# Patient Record
Sex: Female | Born: 1935 | Race: White | Hispanic: No | Smoking: Never smoker
Health system: Southern US, Community
[De-identification: ages and names within clinical notes are randomized; demographics above are authoritative.]

## PROBLEM LIST (undated history)

## (undated) DIAGNOSIS — H547 Unspecified visual loss: Secondary | ICD-10-CM

## (undated) DIAGNOSIS — E782 Mixed hyperlipidemia: Secondary | ICD-10-CM

## (undated) DIAGNOSIS — E119 Type 2 diabetes mellitus without complications: Secondary | ICD-10-CM

## (undated) DIAGNOSIS — E039 Hypothyroidism, unspecified: Secondary | ICD-10-CM

## (undated) DIAGNOSIS — K635 Polyp of colon: Secondary | ICD-10-CM

## (undated) DIAGNOSIS — G458 Other transient cerebral ischemic attacks and related syndromes: Secondary | ICD-10-CM

## (undated) DIAGNOSIS — C801 Malignant (primary) neoplasm, unspecified: Secondary | ICD-10-CM

## (undated) DIAGNOSIS — E785 Hyperlipidemia, unspecified: Secondary | ICD-10-CM

## (undated) DIAGNOSIS — I10 Essential (primary) hypertension: Secondary | ICD-10-CM

## (undated) DIAGNOSIS — I5023 Acute on chronic systolic (congestive) heart failure: Secondary | ICD-10-CM

## (undated) DIAGNOSIS — M170 Bilateral primary osteoarthritis of knee: Secondary | ICD-10-CM

## (undated) DIAGNOSIS — E1169 Type 2 diabetes mellitus with other specified complication: Secondary | ICD-10-CM

## (undated) HISTORY — PX: ABDOMINAL HYSTERECTOMY: SHX81

## (undated) HISTORY — DX: Acute on chronic systolic (congestive) heart failure: I50.23

## (undated) HISTORY — PX: MASTECTOMY: SHX3

## (undated) HISTORY — DX: Type 2 diabetes mellitus with other specified complication: E11.69

## (undated) HISTORY — DX: Bilateral primary osteoarthritis of knee: M17.0

## (undated) HISTORY — DX: Mixed hyperlipidemia: E78.2

## (undated) HISTORY — DX: Essential (primary) hypertension: I10

## (undated) HISTORY — DX: Hypothyroidism, unspecified: E03.9

## (undated) HISTORY — PX: REPLACEMENT TOTAL KNEE BILATERAL: SUR1225

---

## 1998-12-11 HISTORY — PX: TOTAL HIP ARTHROPLASTY: SHX124

## 2005-01-31 ENCOUNTER — Ambulatory Visit: Payer: Self-pay

## 2005-03-08 ENCOUNTER — Ambulatory Visit: Payer: Self-pay | Admitting: Physician Assistant

## 2005-06-22 ENCOUNTER — Ambulatory Visit: Payer: Self-pay | Admitting: Internal Medicine

## 2005-08-01 ENCOUNTER — Inpatient Hospital Stay: Payer: Self-pay | Admitting: General Practice

## 2005-08-06 ENCOUNTER — Encounter: Payer: Self-pay | Admitting: Internal Medicine

## 2005-08-11 ENCOUNTER — Encounter: Payer: Self-pay | Admitting: Internal Medicine

## 2005-08-28 ENCOUNTER — Encounter: Payer: Self-pay | Admitting: General Practice

## 2005-09-10 ENCOUNTER — Encounter: Payer: Self-pay | Admitting: Internal Medicine

## 2005-09-10 ENCOUNTER — Encounter: Payer: Self-pay | Admitting: General Practice

## 2005-10-11 ENCOUNTER — Encounter: Payer: Self-pay | Admitting: General Practice

## 2005-10-11 ENCOUNTER — Encounter: Payer: Self-pay | Admitting: Internal Medicine

## 2005-11-10 ENCOUNTER — Encounter: Payer: Self-pay | Admitting: Internal Medicine

## 2005-12-11 ENCOUNTER — Encounter: Payer: Self-pay | Admitting: Internal Medicine

## 2006-01-11 ENCOUNTER — Encounter: Payer: Self-pay | Admitting: Internal Medicine

## 2006-02-08 ENCOUNTER — Encounter: Payer: Self-pay | Admitting: Internal Medicine

## 2006-03-11 ENCOUNTER — Encounter: Payer: Self-pay | Admitting: Internal Medicine

## 2006-04-10 ENCOUNTER — Encounter: Payer: Self-pay | Admitting: Internal Medicine

## 2006-05-11 ENCOUNTER — Encounter: Payer: Self-pay | Admitting: Internal Medicine

## 2006-06-10 ENCOUNTER — Encounter: Payer: Self-pay | Admitting: Internal Medicine

## 2006-07-11 ENCOUNTER — Encounter: Payer: Self-pay | Admitting: Internal Medicine

## 2006-07-26 ENCOUNTER — Ambulatory Visit: Payer: Self-pay | Admitting: Internal Medicine

## 2006-08-06 ENCOUNTER — Ambulatory Visit: Payer: Self-pay | Admitting: Internal Medicine

## 2006-08-11 ENCOUNTER — Encounter: Payer: Self-pay | Admitting: Internal Medicine

## 2006-08-14 ENCOUNTER — Ambulatory Visit: Payer: Self-pay | Admitting: Gastroenterology

## 2006-09-10 ENCOUNTER — Encounter: Payer: Self-pay | Admitting: Internal Medicine

## 2006-10-11 ENCOUNTER — Encounter: Payer: Self-pay | Admitting: Internal Medicine

## 2007-05-01 ENCOUNTER — Ambulatory Visit: Payer: Self-pay | Admitting: Gastroenterology

## 2007-05-29 ENCOUNTER — Ambulatory Visit: Payer: Self-pay | Admitting: Gastroenterology

## 2008-08-19 ENCOUNTER — Ambulatory Visit: Payer: Self-pay | Admitting: General Practice

## 2008-09-02 ENCOUNTER — Inpatient Hospital Stay: Payer: Self-pay | Admitting: General Practice

## 2008-09-08 ENCOUNTER — Encounter: Payer: Self-pay | Admitting: Internal Medicine

## 2008-09-10 ENCOUNTER — Encounter: Payer: Self-pay | Admitting: Internal Medicine

## 2009-02-17 ENCOUNTER — Ambulatory Visit: Payer: Self-pay | Admitting: General Practice

## 2009-10-06 ENCOUNTER — Ambulatory Visit: Payer: Self-pay | Admitting: Ophthalmology

## 2009-10-27 ENCOUNTER — Ambulatory Visit: Payer: Self-pay | Admitting: Ophthalmology

## 2010-06-01 ENCOUNTER — Ambulatory Visit: Payer: Self-pay | Admitting: Internal Medicine

## 2010-09-06 ENCOUNTER — Ambulatory Visit: Payer: Self-pay | Admitting: Ophthalmology

## 2010-09-14 ENCOUNTER — Ambulatory Visit: Payer: Self-pay | Admitting: Ophthalmology

## 2011-02-10 ENCOUNTER — Ambulatory Visit: Payer: Self-pay | Admitting: Otolaryngology

## 2011-05-26 ENCOUNTER — Emergency Department: Payer: Self-pay | Admitting: Internal Medicine

## 2011-12-06 ENCOUNTER — Inpatient Hospital Stay: Payer: Self-pay | Admitting: Internal Medicine

## 2011-12-11 ENCOUNTER — Encounter: Payer: Self-pay | Admitting: Internal Medicine

## 2011-12-12 ENCOUNTER — Encounter: Payer: Self-pay | Admitting: Internal Medicine

## 2011-12-19 ENCOUNTER — Ambulatory Visit: Payer: Self-pay | Admitting: Internal Medicine

## 2011-12-19 LAB — BASIC METABOLIC PANEL
Anion Gap: 8 (ref 7–16)
BUN: 33 mg/dL — ABNORMAL HIGH (ref 7–18)
Chloride: 103 mmol/L (ref 98–107)
Co2: 26 mmol/L (ref 21–32)
Glucose: 92 mg/dL (ref 65–99)
Osmolality: 281 (ref 275–301)
Potassium: 4.6 mmol/L (ref 3.5–5.1)
Sodium: 137 mmol/L (ref 136–145)

## 2011-12-19 LAB — CBC WITH DIFFERENTIAL/PLATELET
Basophil #: 0 10*3/uL (ref 0.0–0.1)
Eosinophil #: 0.1 10*3/uL (ref 0.0–0.7)
Eosinophil %: 0.4 %
HGB: 13.1 g/dL (ref 12.0–16.0)
MCH: 31.8 pg (ref 26.0–34.0)
MCHC: 32.9 g/dL (ref 32.0–36.0)
Monocyte #: 1 10*3/uL — ABNORMAL HIGH (ref 0.0–0.7)
Neutrophil %: 78.3 %
Platelet: 166 10*3/uL (ref 150–440)
WBC: 16.9 10*3/uL — ABNORMAL HIGH (ref 3.6–11.0)

## 2011-12-28 LAB — CBC WITH DIFFERENTIAL/PLATELET
Basophil #: 0 10*3/uL (ref 0.0–0.1)
Basophil %: 0.2 %
Eosinophil %: 0.5 %
HCT: 36.2 % (ref 35.0–47.0)
HGB: 12.3 g/dL (ref 12.0–16.0)
Lymphocyte #: 2 10*3/uL (ref 1.0–3.6)
MCH: 32.6 pg (ref 26.0–34.0)
MCV: 96 fL (ref 80–100)
Monocyte #: 0.7 10*3/uL (ref 0.0–0.7)
Monocyte %: 6.2 %
Platelet: 119 10*3/uL — ABNORMAL LOW (ref 150–440)
RBC: 3.77 10*6/uL — ABNORMAL LOW (ref 3.80–5.20)
RDW: 12.9 % (ref 11.5–14.5)
WBC: 11.7 10*3/uL — ABNORMAL HIGH (ref 3.6–11.0)

## 2012-01-12 ENCOUNTER — Encounter: Payer: Self-pay | Admitting: Internal Medicine

## 2015-08-14 ENCOUNTER — Emergency Department: Payer: Medicare Other

## 2015-08-14 ENCOUNTER — Inpatient Hospital Stay
Admission: EM | Admit: 2015-08-14 | Discharge: 2015-08-17 | DRG: 872 | Disposition: A | Discharge status: Home Health | Payer: Medicare Other | Attending: Internal Medicine | Admitting: Internal Medicine

## 2015-08-14 ENCOUNTER — Encounter: Payer: Self-pay | Admitting: Emergency Medicine

## 2015-08-14 DIAGNOSIS — K529 Noninfective gastroenteritis and colitis, unspecified: Secondary | ICD-10-CM | POA: Diagnosis present

## 2015-08-14 DIAGNOSIS — N3289 Other specified disorders of bladder: Secondary | ICD-10-CM | POA: Diagnosis present

## 2015-08-14 DIAGNOSIS — N189 Chronic kidney disease, unspecified: Secondary | ICD-10-CM | POA: Diagnosis present

## 2015-08-14 DIAGNOSIS — I10 Essential (primary) hypertension: Secondary | ICD-10-CM | POA: Diagnosis present

## 2015-08-14 DIAGNOSIS — R918 Other nonspecific abnormal finding of lung field: Secondary | ICD-10-CM

## 2015-08-14 DIAGNOSIS — A4151 Sepsis due to Escherichia coli [E. coli]: Secondary | ICD-10-CM | POA: Diagnosis present

## 2015-08-14 DIAGNOSIS — Z888 Allergy status to other drugs, medicaments and biological substances status: Secondary | ICD-10-CM | POA: Diagnosis not present

## 2015-08-14 DIAGNOSIS — E785 Hyperlipidemia, unspecified: Secondary | ICD-10-CM | POA: Diagnosis present

## 2015-08-14 DIAGNOSIS — E860 Dehydration: Secondary | ICD-10-CM

## 2015-08-14 DIAGNOSIS — M62830 Muscle spasm of back: Secondary | ICD-10-CM | POA: Diagnosis present

## 2015-08-14 DIAGNOSIS — Z880 Allergy status to penicillin: Secondary | ICD-10-CM

## 2015-08-14 DIAGNOSIS — Z853 Personal history of malignant neoplasm of breast: Secondary | ICD-10-CM

## 2015-08-14 DIAGNOSIS — N3000 Acute cystitis without hematuria: Secondary | ICD-10-CM

## 2015-08-14 DIAGNOSIS — Z7982 Long term (current) use of aspirin: Secondary | ICD-10-CM | POA: Diagnosis not present

## 2015-08-14 DIAGNOSIS — Z79899 Other long term (current) drug therapy: Secondary | ICD-10-CM | POA: Diagnosis not present

## 2015-08-14 DIAGNOSIS — Z66 Do not resuscitate: Secondary | ICD-10-CM | POA: Diagnosis present

## 2015-08-14 DIAGNOSIS — E119 Type 2 diabetes mellitus without complications: Secondary | ICD-10-CM

## 2015-08-14 DIAGNOSIS — R1110 Vomiting, unspecified: Secondary | ICD-10-CM

## 2015-08-14 DIAGNOSIS — Z96642 Presence of left artificial hip joint: Secondary | ICD-10-CM | POA: Diagnosis present

## 2015-08-14 DIAGNOSIS — H548 Legal blindness, as defined in USA: Secondary | ICD-10-CM | POA: Diagnosis present

## 2015-08-14 DIAGNOSIS — I129 Hypertensive chronic kidney disease with stage 1 through stage 4 chronic kidney disease, or unspecified chronic kidney disease: Secondary | ICD-10-CM | POA: Diagnosis present

## 2015-08-14 DIAGNOSIS — E1122 Type 2 diabetes mellitus with diabetic chronic kidney disease: Secondary | ICD-10-CM | POA: Diagnosis present

## 2015-08-14 DIAGNOSIS — R062 Wheezing: Secondary | ICD-10-CM | POA: Diagnosis present

## 2015-08-14 DIAGNOSIS — R200 Anesthesia of skin: Secondary | ICD-10-CM | POA: Diagnosis present

## 2015-08-14 DIAGNOSIS — E875 Hyperkalemia: Secondary | ICD-10-CM | POA: Diagnosis present

## 2015-08-14 DIAGNOSIS — A419 Sepsis, unspecified organism: Secondary | ICD-10-CM | POA: Diagnosis present

## 2015-08-14 DIAGNOSIS — N390 Urinary tract infection, site not specified: Secondary | ICD-10-CM | POA: Diagnosis present

## 2015-08-14 DIAGNOSIS — R197 Diarrhea, unspecified: Secondary | ICD-10-CM

## 2015-08-14 HISTORY — DX: Type 2 diabetes mellitus without complications: E11.9

## 2015-08-14 HISTORY — DX: Polyp of colon: K63.5

## 2015-08-14 HISTORY — DX: Other transient cerebral ischemic attacks and related syndromes: G45.8

## 2015-08-14 HISTORY — DX: Malignant (primary) neoplasm, unspecified: C80.1

## 2015-08-14 HISTORY — DX: Essential (primary) hypertension: I10

## 2015-08-14 HISTORY — DX: Hyperlipidemia, unspecified: E78.5

## 2015-08-14 HISTORY — DX: Unspecified visual loss: H54.7

## 2015-08-14 LAB — GLUCOSE, CAPILLARY
GLUCOSE-CAPILLARY: 106 mg/dL — AB (ref 65–99)
GLUCOSE-CAPILLARY: 132 mg/dL — AB (ref 65–99)
Glucose-Capillary: 89 mg/dL (ref 65–99)
Glucose-Capillary: 106 mg/dL — ABNORMAL HIGH (ref 65–99)

## 2015-08-14 LAB — COMPREHENSIVE METABOLIC PANEL
ALT: 14 U/L (ref 14–54)
AST: 34 U/L (ref 15–41)
Albumin: 3.6 g/dL (ref 3.5–5.0)
Alkaline Phosphatase: 63 U/L (ref 38–126)
Anion gap: 9 (ref 5–15)
BILIRUBIN TOTAL: 0.9 mg/dL (ref 0.3–1.2)
BUN: 28 mg/dL — AB (ref 6–20)
CHLORIDE: 102 mmol/L (ref 101–111)
CO2: 24 mmol/L (ref 22–32)
Calcium: 8.9 mg/dL (ref 8.9–10.3)
Creatinine, Ser: 1.32 mg/dL — ABNORMAL HIGH (ref 0.44–1.00)
GFR calc Af Amer: 40 mL/min — ABNORMAL LOW (ref >60)
GFR calc non Af Amer: 34 mL/min — ABNORMAL LOW (ref >60)
Glucose, Bld: 179 mg/dL — ABNORMAL HIGH (ref 65–99)
Potassium: 6 mmol/L — ABNORMAL HIGH (ref 3.5–5.1)
Sodium: 135 mmol/L (ref 135–145)
Total Protein: 7 g/dL (ref 6.5–8.1)

## 2015-08-14 LAB — CBC
HEMATOCRIT: 34.2 % — AB (ref 35.0–47.0)
HEMATOCRIT: 39.5 % (ref 35.0–47.0)
Hemoglobin: 11.3 g/dL — ABNORMAL LOW (ref 12.0–16.0)
Hemoglobin: 12.7 g/dL (ref 12.0–16.0)
MCH: 30.9 pg (ref 26.0–34.0)
MCH: 31 pg (ref 26.0–34.0)
MCHC: 32.3 g/dL (ref 32.0–36.0)
MCHC: 33.1 g/dL (ref 32.0–36.0)
MCV: 93.7 fL (ref 80.0–100.0)
MCV: 95.7 fL (ref 80.0–100.0)
PLATELETS: 137 10*3/uL — AB (ref 150–440)
PLATELETS: 165 10*3/uL (ref 150–440)
RBC: 3.66 MIL/uL — ABNORMAL LOW (ref 3.80–5.20)
RBC: 4.13 MIL/uL (ref 3.80–5.20)
RDW: 13.7 % (ref 11.5–14.5)
RDW: 13.9 % (ref 11.5–14.5)
WBC: 11.2 10*3/uL — AB (ref 3.6–11.0)
WBC: 17.7 10*3/uL — AB (ref 3.6–11.0)

## 2015-08-14 LAB — HEMOGLOBIN A1C: Hgb A1c MFr Bld: 5.9 % (ref 4.0–6.0)

## 2015-08-14 LAB — LACTIC ACID, PLASMA
Lactic Acid, Venous: 2.5 mmol/L (ref 0.5–2.0)
Lactic Acid, Venous: 2.2 mmol/L (ref 0.5–2.0)
Lactic Acid, Venous: 1.4 mmol/L (ref 0.5–2.0)

## 2015-08-14 LAB — URINALYSIS COMPLETE WITH MICROSCOPIC (ARMC ONLY)
BILIRUBIN URINE: NEGATIVE
Glucose, UA: 150 mg/dL — AB
HGB URINE DIPSTICK: NEGATIVE
NITRITE: POSITIVE — AB
PH: 6 (ref 5.0–8.0)
Protein, ur: NEGATIVE mg/dL
SPECIFIC GRAVITY, URINE: 1.014 (ref 1.005–1.030)

## 2015-08-14 LAB — TROPONIN I: Troponin I: 0.03 ng/mL (ref <0.031)

## 2015-08-14 LAB — BASIC METABOLIC PANEL
Anion gap: 5 (ref 5–15)
BUN: 25 mg/dL — AB (ref 6–20)
CHLORIDE: 105 mmol/L (ref 101–111)
CO2: 28 mmol/L (ref 22–32)
CREATININE: 1.09 mg/dL — AB (ref 0.44–1.00)
Calcium: 8.1 mg/dL — ABNORMAL LOW (ref 8.9–10.3)
GFR calc Af Amer: 50 mL/min — ABNORMAL LOW (ref >60)
GFR calc non Af Amer: 43 mL/min — ABNORMAL LOW (ref >60)
GLUCOSE: 95 mg/dL (ref 65–99)
POTASSIUM: 4.4 mmol/L (ref 3.5–5.1)
SODIUM: 138 mmol/L (ref 135–145)

## 2015-08-14 LAB — PHOSPHORUS: PHOSPHORUS: 3 mg/dL (ref 2.5–4.6)

## 2015-08-14 LAB — MAGNESIUM: Magnesium: 1.8 mg/dL (ref 1.7–2.4)

## 2015-08-14 MED ORDER — SODIUM POLYSTYRENE SULFONATE 15 GM/60ML PO SUSP
15.0000 g | Freq: Once | ORAL | Status: AC
  Administered 2015-08-14: 15 g via ORAL
  Filled 2015-08-14: qty 60

## 2015-08-14 MED ORDER — SODIUM CHLORIDE 0.9 % IV BOLUS (SEPSIS)
1000.0000 mL | Freq: Once | INTRAVENOUS | Status: AC
  Administered 2015-08-14: 1000 mL via INTRAVENOUS

## 2015-08-14 MED ORDER — ONDANSETRON HCL 4 MG PO TABS
4.0000 mg | ORAL_TABLET | Freq: Four times a day (QID) | ORAL | Status: DC | PRN
Start: 2015-08-14 — End: 2015-08-17

## 2015-08-14 MED ORDER — PREDNISOLONE ACETATE 1 % OP SUSP: 1.0000 [drp] | Freq: Every day | OPHTHALMIC | Status: DC

## 2015-08-14 MED ORDER — ACETAMINOPHEN 325 MG PO TABS
650.0000 mg | ORAL_TABLET | Freq: Four times a day (QID) | ORAL | Status: DC | PRN
  Administered 2015-08-15 – 2015-08-17 (×5): 650 mg via ORAL
  Filled 2015-08-14 (×6): qty 2

## 2015-08-14 MED ORDER — INSULIN ASPART 100 UNIT/ML ~~LOC~~ SOLN
10.0000 [IU] | Freq: Once | SUBCUTANEOUS | Status: AC
  Administered 2015-08-14: 10 [IU] via INTRAVENOUS
  Filled 2015-08-14: qty 10

## 2015-08-14 MED ORDER — SODIUM BICARBONATE 8.4 % IV SOLN
50.0000 meq | Freq: Once | INTRAVENOUS | Status: AC
  Administered 2015-08-14: 50 meq via INTRAVENOUS
  Filled 2015-08-14: qty 50

## 2015-08-14 MED ORDER — BUDESONIDE-FORMOTEROL FUMARATE 160-4.5 MCG/ACT IN AERO
2.0000 | INHALATION_SPRAY | Freq: Two times a day (BID) | RESPIRATORY_TRACT | Status: DC
  Administered 2015-08-14 – 2015-08-16 (×6): 2 via RESPIRATORY_TRACT
  Filled 2015-08-14: qty 6

## 2015-08-14 MED ORDER — SODIUM CHLORIDE 0.9 % IV SOLN
INTRAVENOUS | Status: DC
  Administered 2015-08-14: 17:00:00 via INTRAVENOUS

## 2015-08-14 MED ORDER — PREDNISOLONE ACETATE 1 % OP SUSP
1.0000 [drp] | Freq: Every day | OPHTHALMIC | Status: DC
  Administered 2015-08-14 – 2015-08-17 (×4): 1 [drp] via OPHTHALMIC
  Filled 2015-08-14: qty 1

## 2015-08-14 MED ORDER — ALBUTEROL SULFATE (2.5 MG/3ML) 0.083% IN NEBU
2.5000 mg | INHALATION_SOLUTION | Freq: Once | RESPIRATORY_TRACT | Status: AC
  Administered 2015-08-14: 2.5 mg via RESPIRATORY_TRACT
  Filled 2015-08-14: qty 3

## 2015-08-14 MED ORDER — SODIUM CHLORIDE 0.9 % IJ SOLN
3.0000 mL | Freq: Two times a day (BID) | INTRAMUSCULAR | Status: DC
  Administered 2015-08-14: 3 mL via INTRAVENOUS

## 2015-08-14 MED ORDER — LEVOFLOXACIN IN D5W 750 MG/150ML IV SOLN
750.0000 mg | Freq: Once | INTRAVENOUS | Status: AC
  Administered 2015-08-14: 750 mg via INTRAVENOUS
  Filled 2015-08-14: qty 150

## 2015-08-14 MED ORDER — TIOTROPIUM BROMIDE MONOHYDRATE 18 MCG IN CAPS
18.0000 ug | ORAL_CAPSULE | Freq: Every day | RESPIRATORY_TRACT | Status: DC
  Administered 2015-08-14 – 2015-08-16 (×3): 18 ug via RESPIRATORY_TRACT
  Filled 2015-08-14: qty 5

## 2015-08-14 MED ORDER — INSULIN ASPART 100 UNIT/ML ~~LOC~~ SOLN
0.0000 [IU] | Freq: Three times a day (TID) | SUBCUTANEOUS | Status: DC
  Administered 2015-08-15 – 2015-08-16 (×4): 1 [IU] via SUBCUTANEOUS
  Administered 2015-08-17: 2 [IU] via SUBCUTANEOUS
  Filled 2015-08-14: qty 1
  Filled 2015-08-14: qty 2
  Filled 2015-08-14 (×2): qty 1

## 2015-08-14 MED ORDER — HEPARIN SODIUM (PORCINE) 5000 UNIT/ML IJ SOLN
5000.0000 [IU] | Freq: Three times a day (TID) | INTRAMUSCULAR | Status: DC
  Administered 2015-08-14 – 2015-08-17 (×7): 5000 [IU] via SUBCUTANEOUS
  Filled 2015-08-14 (×7): qty 1

## 2015-08-14 MED ORDER — IOHEXOL 300 MG/ML  SOLN
80.0000 mL | Freq: Once | INTRAMUSCULAR | Status: AC | PRN
  Administered 2015-08-14: 80 mL via INTRAVENOUS

## 2015-08-14 MED ORDER — DEXTROSE 50 % IV SOLN
1.0000 | Freq: Once | INTRAVENOUS | Status: AC
  Administered 2015-08-14: 50 mL via INTRAVENOUS
  Filled 2015-08-14: qty 50

## 2015-08-14 MED ORDER — ALBUTEROL SULFATE (2.5 MG/3ML) 0.083% IN NEBU
2.5000 mg | INHALATION_SOLUTION | RESPIRATORY_TRACT | Status: DC
  Administered 2015-08-14 (×2): 2.5 mg via RESPIRATORY_TRACT
  Filled 2015-08-14 (×3): qty 3

## 2015-08-14 MED ORDER — ONDANSETRON HCL 4 MG/2ML IJ SOLN: 4.0000 mg | Freq: Four times a day (QID) | INTRAMUSCULAR | Status: DC | PRN

## 2015-08-14 MED ORDER — ASPIRIN EC 81 MG PO TBEC
81.0000 mg | DELAYED_RELEASE_TABLET | Freq: Every day | ORAL | Status: DC
  Administered 2015-08-14 – 2015-08-17 (×4): 81 mg via ORAL
  Filled 2015-08-14 (×4): qty 1

## 2015-08-14 MED ORDER — SIMVASTATIN 10 MG PO TABS
10.0000 mg | ORAL_TABLET | Freq: Every day | ORAL | Status: DC
  Administered 2015-08-14 – 2015-08-17 (×4): 10 mg via ORAL
  Filled 2015-08-14 (×4): qty 1

## 2015-08-14 MED ORDER — LEVOFLOXACIN IN D5W 250 MG/50ML IV SOLN
250.0000 mg | INTRAVENOUS | Status: DC
  Administered 2015-08-15 – 2015-08-16 (×2): 250 mg via INTRAVENOUS
  Filled 2015-08-14 (×3): qty 50

## 2015-08-14 MED ORDER — IOHEXOL 240 MG/ML SOLN
25.0000 mL | Freq: Once | INTRAMUSCULAR | Status: AC | PRN
  Administered 2015-08-14: 25 mL via ORAL

## 2015-08-14 MED ORDER — PHENAZOPYRIDINE HCL 100 MG PO TABS
100.0000 mg | ORAL_TABLET | Freq: Three times a day (TID) | ORAL | Status: DC
  Administered 2015-08-14 – 2015-08-16 (×7): 100 mg via ORAL
  Filled 2015-08-14 (×7): qty 1

## 2015-08-14 MED ORDER — ACETAMINOPHEN 650 MG RE SUPP: 650.0000 mg | Freq: Four times a day (QID) | RECTAL | Status: DC | PRN

## 2015-08-14 MED ORDER — INSULIN ASPART 100 UNIT/ML ~~LOC~~ SOLN: 0.0000 [IU] | Freq: Every day | SUBCUTANEOUS | Status: DC

## 2015-08-14 MED ORDER — SODIUM CHLORIDE 0.9 % IJ SOLN: 3.0000 mL | INTRAMUSCULAR | Status: DC | PRN

## 2015-08-14 NOTE — ED Notes (Signed)
Upon turning pt, red bottom noted, no breakdown visualized.

## 2015-08-14 NOTE — Progress Notes (Signed)
NSR. Room air. FS are stable. Takes meds ok. Lactic acid is reduced. Pt has not reported any pain. Incontinent. Pt has no further concerns at this time.

## 2015-08-14 NOTE — H&P (Addendum)
[facility] [place] NAME: Gerica Koble    MR#:  [id number]  DATE OF BIRTH:  [birth year]  DATE OF ADMISSION:  08/14/2015  PRIMARY CARE PHYSICIAN: No primary care provider on file.   REQUESTING/REFERRING PHYSICIAN: Dahlia Client, M.D.  CHIEF COMPLAINT:   Chief Complaint  Patient presents with  . Emesis  . Diarrhea    HISTORY OF PRESENT ILLNESS:  Zeynab Gomm  is a 81 y.o. female who presents with nausea and vomiting, and diarrhea. Patient states that the reason she called for assistance at her assisted living facility with that she was unable to get up off of the commode. She was concerned that she was having a stroke. She states that she was having significant weakness, though she is not able to pinpoint any truly focal weakness, and that she was having some numbness in her left leg which is what her concern for stroke. When she got here to the ED she was noted to have elevated white blood cell count, elevated potassium, elevated lactic acid, and nitrate positive UA. Hospitalists were called for admission for sepsis secondary to UTI.  PAST MEDICAL HISTORY:   Past Medical History  Diagnosis Date  . Hypertension   . Diabetes mellitus without complication   . Cancer     breast, s/p mastectomy, no recurrence  . Hyperlipidemia   . Subclavian steal syndrome   . Blind   . Colon polyps     PAST SURGICAL HISTORY:   Past Surgical History  Procedure Laterality Date  . Replacement total knee bilateral    . Total hip arthroplasty Left   . Abdominal hysterectomy    . Mastectomy Right     SOCIAL HISTORY:   Social History  Substance Use Topics  . Smoking status: Never Smoker   . Smokeless tobacco: Not on file  . Alcohol Use: No    FAMILY HISTORY:   Family History  Problem Relation Age of Onset  . Colon cancer Brother     DRUG ALLERGIES:   Allergies  Allergen Reactions  . Penicillins Anaphylaxis  . Lodine [Etodolac]      MEDICATIONS AT HOME:   Prior to Admission medications   Medication Sig Start Date End Date Taking? Authorizing Provider  aspirin 81 MG tablet Take 81 mg by mouth daily.   Yes Historical Provider, MD  glipiZIDE (GLUCOTROL) 5 MG tablet Take 5 mg by mouth daily before breakfast.   Yes Historical Provider, MD  loratadine (CLARITIN) 10 MG tablet Take 10 mg by mouth daily.   Yes Historical Provider, MD  Omega-3 Fatty Acids (FISH OIL) 435 MG CAPS Take 1 capsule by mouth daily.   Yes Historical Provider, MD  simvastatin (ZOCOR) 10 MG tablet Take 10 mg by mouth daily.   Yes Historical Provider, MD  lisinopril-hydrochlorothiazide (PRINZIDE,ZESTORETIC) 10-12.5 MG per tablet Take 1 tablet by mouth daily. 06/30/15   Historical Provider, MD    REVIEW OF SYSTEMS:  Review of Systems  Constitutional: Negative for fever, chills, weight loss and malaise/fatigue.  HENT: Negative for ear pain, hearing loss and tinnitus.   Eyes: Negative for blurred vision, double vision, pain and redness.  Respiratory: Negative for cough, hemoptysis and shortness of breath.   Cardiovascular: Negative for chest pain, palpitations, orthopnea and leg swelling.  Gastrointestinal: Positive for nausea, vomiting and diarrhea. Negative for abdominal pain and constipation.  Genitourinary: Negative for dysuria, frequency and hematuria.  Musculoskeletal: Negative for back pain, joint pain and neck pain.  Skin:       No acne, rash, or lesions  Neurological: Positive for weakness. Negative for dizziness, tremors and focal weakness.  Endo/Heme/Allergies: Negative for polydipsia. Does not bruise/bleed easily.  Psychiatric/Behavioral: Negative for depression. The patient is not nervous/anxious and does not have insomnia.      VITAL SIGNS:   Filed Vitals:   08/14/15 0330 08/14/15 0400 08/14/15 0430 08/14/15 0600  BP: 148/102 162/71 142/88 131/60  Pulse: 86 87 85 85  Temp:      TempSrc:      Resp: 17 15 18 22   Height:       Weight:      SpO2: 99% 99% 99% 95%   Wt Readings from Last 3 Encounters:  08/14/15 85.9 kg (189 lb 6 oz)    PHYSICAL EXAMINATION:  Physical Exam  Vitals reviewed. Constitutional: She is oriented to person, place, and time. She appears well-developed and well-nourished. No distress.  HENT:  Head: Normocephalic and atraumatic.  Mouth/Throat: Oropharynx is clear and moist.  Eyes: Conjunctivae and EOM are normal. Pupils are equal, round, and reactive to light. No scleral icterus.  Neck: Normal range of motion. Neck supple. No JVD present. No thyromegaly present.  Cardiovascular: Normal rate, regular rhythm and intact distal pulses.  Exam reveals no gallop and no friction rub.   No murmur heard. Respiratory: Effort normal and breath sounds normal. No respiratory distress. She has no wheezes. She has no rales.  Prior mastectomy  GI: Soft. Bowel sounds are normal. She exhibits no distension. There is no tenderness.  Musculoskeletal: Normal range of motion. She exhibits no edema.  No arthritis, no gout  Lymphadenopathy:    She has no cervical adenopathy.  Neurological: She is alert and oriented to person, place, and time. No cranial nerve deficit.  No dysarthria, no aphasia  Skin: Skin is warm and dry. No rash noted. No erythema.  Psychiatric: She has a normal mood and affect. Her behavior is normal. Judgment and thought content normal.    LABORATORY PANEL:   CBC  Recent Labs Lab 08/14/15 0044  WBC 17.7*  HGB 12.7  HCT 39.5  PLT 165   ------------------------------------------------------------------------------------------------------------------  Chemistries   Recent Labs Lab 08/14/15 0044  NA 135  K 6.0*  CL 102  CO2 24  GLUCOSE 179*  BUN 28*  CREATININE 1.32*  CALCIUM 8.9  AST 34  ALT 14  ALKPHOS 63  BILITOT 0.9   ------------------------------------------------------------------------------------------------------------------  Cardiac Enzymes  Recent  Labs Lab 08/14/15 0044  TROPONINI 0.03   ------------------------------------------------------------------------------------------------------------------  RADIOLOGY:  Dg Chest 2 View  08/14/2015   CLINICAL DATA:  Hypoxia, nausea and vomiting.  Onset tonight.  EXAM: CHEST  2 VIEW  COMPARISON:  12/19/2011  FINDINGS: There is mild hyperinflation. There is mild unchanged cardiomegaly. There is ill-defined right apical opacity superimposed on the first anterior rib end, indeterminate. The lungs are otherwise clear. The pulmonary vasculature is normal. There are no pleural effusions. Hilar and mediastinal contours are unremarkable.  IMPRESSION: Hyperinflation.  Ill-defined 1 cm opacity in the right apex appears to be a change from prior studies. Although it could represent superimposition, a small neoplasm is not entirely excluded. If additional imaging is clinically warranted, chest CT would resolve this.   Electronically Signed   By: Andreas Newport M.D.   On: 08/14/2015 02:37   Ct Head Wo Contrast  08/14/2015   CLINICAL DATA:  Hypoxia.  Vomiting.  Upper extremity paresthesias.  EXAM: CT HEAD WITHOUT CONTRAST  TECHNIQUE:  Contiguous axial images were obtained from the base of the skull through the vertex without intravenous contrast.  COMPARISON:  None.  FINDINGS: There is no intracranial hemorrhage, mass or evidence of acute infarction. There is mild generalized atrophy. There is mild chronic microvascular ischemic change. There are physiologic basal ganglia calcifications. No focal brain abnormality is evident. There is no significant extra-axial fluid collection.  No acute intracranial findings are evident. The visible paranasal sinuses are clear.  IMPRESSION: Mild generalized atrophy and chronic small vessel changes. No acute findings.   Electronically Signed   By: Andreas Newport M.D.   On: 08/14/2015 02:34   Ct Abdomen Pelvis W Contrast  08/14/2015   CLINICAL DATA:  Hypoxia.  Nausea and vomiting.   EXAM: CT ABDOMEN AND PELVIS WITH CONTRAST  TECHNIQUE: Multidetector CT imaging of the abdomen and pelvis was performed using the standard protocol following bolus administration of intravenous contrast.  CONTRAST:  80mL OMNIPAQUE IOHEXOL 300 MG/ML  SOLN  COMPARISON:  None.  FINDINGS: Lower chest:  Mild linear scarring in both bases.  Hepatobiliary: There is a 4.4 cm cyst in the left hepatic lobe laterally and a 2.5 cm cyst in the right hepatic lobe posteriorly. There are otherwise unremarkable appearances of the liver and bile ducts.  Pancreas: Mild fatty replacement. No pancreatic mass or duct dilatation. No acute inflammatory changes.  Spleen: Normal except for a splenic granuloma.  Adrenals/Urinary Tract: There is a 1.5 cm cyst at the anterior aspect of the right kidney. No suspicious renal masses are evident. There is no hydronephrosis or ureteral dilatation. No calculi are evident, but sensitivity is reduced due to the presence of intravenous contrast in the collecting systems. Adrenals are normal. Urinary bladder is unremarkable.  Stomach/Bowel: There is a hiatal hernia. The stomach and small bowel are otherwise unremarkable. There is extensive colonic diverticulosis.  Vascular/Lymphatic: The aorta is heavily calcified but normal in caliber.  Reproductive: Unremarkable. Probable hysterectomy. No adnexal abnormalities are evident. Lower pelvic imaging is degraded by metal artifact from the left hip arthroplasty.  Other: No acute inflammatory changes are evident in the abdomen or pelvis. There is no bowel obstruction. There is no extraluminal air. There is no ascites.  Musculoskeletal: No significant musculoskeletal lesions.  IMPRESSION: 1. No acute findings are evident in the abdomen or pelvis. 2. Hepatic cysts.  Right renal cyst. 3. Hiatal hernia. 4. Colonic diverticulosis.   Electronically Signed   By: Andreas Newport M.D.   On: 08/14/2015 05:40    EKG:   Orders placed or performed during the hospital  encounter of 08/14/15  . EKG 12-Lead  . EKG 12-Lead    IMPRESSION AND PLAN:  Principal Problem:   Sepsis - UTI with elevated lactic acid and elevated white blood cell count. Fluids given in the ED, will continue fluid administration on admission, recheck lactic acid, antibiotics started in the ED, continue these on admission, blood and urine cultures sent, hemodynamically stable at this time. Active Problems:   UTI (lower urinary tract infection) - antibiotics as above, urine culture sent as above   Hyperkalemia - given insulin and Kayexalate in the ED, recheck potassium now and continue treatment as appropriate   Type II diabetes mellitus - hold home oral anti-glycemic son and patient, carb modified diet, sliding scale insulin with appropriate fingerstick glucose checks.   HTN (hypertension) - blood pressures not currently elevated, hold home antihypertensives for now.   HLD (hyperlipidemia) - continue home statin  All the records are reviewed and  case discussed with ED provider. Management plans discussed with the patient and/or family.  DVT PROPHYLAXIS: SubQ heparin  ADMISSION STATUS: Inpatient  CODE STATUS: DO NOT RESUSCITATE Advance Directive Documentation        Most Recent Value   Type of Advance Directive  Healthcare Power of Attorney, Living will, Out of facility DNR (pink MOST or yellow form) [POA pt's son - Larena Ohnemus Haegele]   Pre-existing out of facility DNR order (yellow form or pink MOST form)  Physician notified to receive inpatient order   "MOST" Form in Place?        TOTAL TIME TAKING CARE OF THIS PATIENT: 50 minutes.    Bern Fare [place] 08/14/2015, 6:32 AM  [facility]  Office  [phone]  CC: Primary care physician; No primary care provider on file.

## 2015-08-14 NOTE — ED Notes (Signed)
Tech assisted pt to toilet for continent void

## 2015-08-14 NOTE — ED Notes (Signed)
Hospitalist at bedside 

## 2015-08-14 NOTE — Progress Notes (Signed)
Skin checked by Rockie Neighbours RN

## 2015-08-14 NOTE — ED Notes (Signed)
[facility] facility, unable to find pt list of meds and medical history in pt apartment. Called pt son [name] [phone] and left message requesting return phone call.    (POA&son) [name] [phone], [phone] Kandra Nicolas [phone]

## 2015-08-14 NOTE — ED Notes (Signed)
Pt to rm 25 from [facility] via EMS.  EMS report n/v/d x 1 hour.  Pt o2sat 70s on site, 3L o2 applied.  EMS report pt not normally on oxygen at home.  EMS report VS stable en route.  PT NAD upon arrival.

## 2015-08-14 NOTE — ED Notes (Signed)
MD at bedside. 

## 2015-08-14 NOTE — ED Notes (Signed)
Pt's son called.  Son given update on pt's condition.  Medication list request, son states he will call back.

## 2015-08-14 NOTE — Progress Notes (Addendum)
[facility] [place] NAME: Kathleen Hampton    MR#:  [id number]  DATE OF BIRTH:  15-Apr-1922  SUBJECTIVE:  CHIEF COMPLAINT:   Chief Complaint  Patient presents with  . Emesis  . Diarrhea   the patient is a 81 year old Caucasian female who presents to the hospital with complaints of nausea, vomiting and diarrhea. She was noted to have urinary tract infection and was admitted for antibiotic therapy. She admits of nausea now, but no recent vomiting or diarrhea. Denies hematemesis. Admits of some abdominal discomfort in the lower abdomen, only when palpated. Admits of urinary frequency.   Review of Systems  Constitutional: Negative for fever, chills and weight loss.  HENT: Negative for congestion.   Eyes: Negative for blurred vision and double vision.  Respiratory: Negative for cough, sputum production, shortness of breath and wheezing.   Cardiovascular: Negative for chest pain, palpitations, orthopnea, leg swelling and PND.  Gastrointestinal: Positive for nausea. Negative for vomiting, abdominal pain, diarrhea, constipation and blood in stool.  Genitourinary: Positive for dysuria, urgency and frequency. Negative for hematuria.  Musculoskeletal: Negative for falls.  Neurological: Negative for dizziness, tremors, focal weakness and headaches.  Endo/Heme/Allergies: Does not bruise/bleed easily.  Psychiatric/Behavioral: Negative for depression. The patient does not have insomnia.     VITAL SIGNS: Blood pressure 146/45, pulse 75, temperature 97.7 F (36.5 C), temperature source Oral, resp. rate 16, height 5\' 4"  (1.626 m), weight 88.225 kg (194 lb 8 oz), SpO2 98 %.  PHYSICAL EXAMINATION:   GENERAL:  81 y.o.-year-old patient lying in the bed with no acute distress.  EYES: Pupils equal, round, reactive to light and accommodation. No scleral icterus. Extraocular muscles intact.  HEENT: Head atraumatic, normocephalic. Oropharynx and nasopharynx  clear.  NECK:  Supple, no jugular venous distention. No thyroid enlargement, no tenderness.  LUNGS: Normal breath sounds bilaterally, no wheezing, rales,rhonchi or crepitation. No use of accessory muscles of respiration.  CARDIOVASCULAR: S1, S2 normal. No murmurs, rubs, or gallops.  ABDOMEN: Soft, tender suprapubically with no rebound or guarding, nondistended. Bowel sounds present. No organomegaly or mass.  EXTREMITIES: No pedal edema, cyanosis, or clubbing.  NEUROLOGIC: Cranial nerves II through XII are intact. Muscle strength 5/5 in all extremities. Sensation intact. Gait not checked.  PSYCHIATRIC: The patient is alert and oriented x 3.  SKIN: No obvious rash, lesion, or ulcer.   ORDERS/RESULTS REVIEWED:   CBC  Recent Labs Lab 08/14/15 0044 08/14/15 0856  WBC 17.7* 11.2*  HGB 12.7 11.3*  HCT 39.5 34.2*  PLT 165 137*  MCV 95.7 93.7  MCH 30.9 31.0  MCHC 32.3 33.1  RDW 13.9 13.7   ------------------------------------------------------------------------------------------------------------------  Chemistries   Recent Labs Lab 08/14/15 0044 08/14/15 0856  NA 135 138  K 6.0* 4.4  CL 102 105  CO2 24 28  GLUCOSE 179* 95  BUN 28* 25*  CREATININE 1.32* 1.09*  CALCIUM 8.9 8.1*  MG  --  1.8  AST 34  --   ALT 14  --   ALKPHOS 63  --   BILITOT 0.9  --    ------------------------------------------------------------------------------------------------------------------ estimated creatinine clearance is 36.1 mL/min (by C-G formula based on Cr of 1.09). ------------------------------------------------------------------------------------------------------------------ No results for input(s): TSH, T4TOTAL, T3FREE, THYROIDAB in the last 72 hours.  Invalid input(s): FREET3  Cardiac Enzymes  Recent Labs Lab 08/14/15 0044  TROPONINI 0.03   ------------------------------------------------------------------------------------------------------------------ Invalid input(s):  POCBNP ---------------------------------------------------------------------------------------------------------------  RADIOLOGY: Dg Chest 2 View  08/14/2015   CLINICAL DATA:  Hypoxia, nausea and vomiting.  Onset tonight.  EXAM: CHEST  2 VIEW  COMPARISON:  12/19/2011  FINDINGS: There is mild hyperinflation. There is mild unchanged cardiomegaly. There is ill-defined right apical opacity superimposed on the first anterior rib end, indeterminate. The lungs are otherwise clear. The pulmonary vasculature is normal. There are no pleural effusions. Hilar and mediastinal contours are unremarkable.  IMPRESSION: Hyperinflation.  Ill-defined 1 cm opacity in the right apex appears to be a change from prior studies. Although it could represent superimposition, a small neoplasm is not entirely excluded. If additional imaging is clinically warranted, chest CT would resolve this.   Electronically Signed   By: Andreas Newport M.D.   On: 08/14/2015 02:37   Ct Head Wo Contrast  08/14/2015   CLINICAL DATA:  Hypoxia.  Vomiting.  Upper extremity paresthesias.  EXAM: CT HEAD WITHOUT CONTRAST  TECHNIQUE: Contiguous axial images were obtained from the base of the skull through the vertex without intravenous contrast.  COMPARISON:  None.  FINDINGS: There is no intracranial hemorrhage, mass or evidence of acute infarction. There is mild generalized atrophy. There is mild chronic microvascular ischemic change. There are physiologic basal ganglia calcifications. No focal brain abnormality is evident. There is no significant extra-axial fluid collection.  No acute intracranial findings are evident. The visible paranasal sinuses are clear.  IMPRESSION: Mild generalized atrophy and chronic small vessel changes. No acute findings.   Electronically Signed   By: Andreas Newport M.D.   On: 08/14/2015 02:34   Ct Abdomen Pelvis W Contrast  08/14/2015   CLINICAL DATA:  Hypoxia.  Nausea and vomiting.  EXAM: CT ABDOMEN AND PELVIS WITH CONTRAST   TECHNIQUE: Multidetector CT imaging of the abdomen and pelvis was performed using the standard protocol following bolus administration of intravenous contrast.  CONTRAST:  80mL OMNIPAQUE IOHEXOL 300 MG/ML  SOLN  COMPARISON:  None.  FINDINGS: Lower chest:  Mild linear scarring in both bases.  Hepatobiliary: There is a 4.4 cm cyst in the left hepatic lobe laterally and a 2.5 cm cyst in the right hepatic lobe posteriorly. There are otherwise unremarkable appearances of the liver and bile ducts.  Pancreas: Mild fatty replacement. No pancreatic mass or duct dilatation. No acute inflammatory changes.  Spleen: Normal except for a splenic granuloma.  Adrenals/Urinary Tract: There is a 1.5 cm cyst at the anterior aspect of the right kidney. No suspicious renal masses are evident. There is no hydronephrosis or ureteral dilatation. No calculi are evident, but sensitivity is reduced due to the presence of intravenous contrast in the collecting systems. Adrenals are normal. Urinary bladder is unremarkable.  Stomach/Bowel: There is a hiatal hernia. The stomach and small bowel are otherwise unremarkable. There is extensive colonic diverticulosis.  Vascular/Lymphatic: The aorta is heavily calcified but normal in caliber.  Reproductive: Unremarkable. Probable hysterectomy. No adnexal abnormalities are evident. Lower pelvic imaging is degraded by metal artifact from the left hip arthroplasty.  Other: No acute inflammatory changes are evident in the abdomen or pelvis. There is no bowel obstruction. There is no extraluminal air. There is no ascites.  Musculoskeletal: No significant musculoskeletal lesions.  IMPRESSION: 1. No acute findings are evident in the abdomen or pelvis. 2. Hepatic cysts.  Right renal cyst. 3. Hiatal hernia. 4. Colonic diverticulosis.   Electronically Signed   By: Andreas Newport M.D.   On: 08/14/2015 05:40    EKG:  Orders placed or performed during the hospital encounter of 08/14/15  . EKG 12-Lead  . EKG  12-Lead  ASSESSMENT AND PLAN:  Principal Problem:   Sepsis Active Problems:   UTI (lower urinary tract infection)   Hyperkalemia   Type II diabetes mellitus   HTN (hypertension)   HLD (hyperlipidemia) 1. Sepsis due to urinary urine tract infection as well as likely acute gastroenteritis of unclear etiology, continue levofloxacin IV , blood cultures are pending. Get stool as well as urine cultures and adjust antibiotic according to culture results 2,. Urinary tract infection, start patient on Pyridium, continue levofloxacin, awaiting for urine cultures. Adjust antibiotics according to culture results as above 3. Acute gastroenteritis. Supportive therapy with IV fluids and antiemetics and antibiotics, get stool cultures if possible 4. Wheezing, no known history of tobacco abuse, unless passive smoking, according to patient's son, initiate patient on duo nebs 5. Hyperkalemia, resolved with IV fluid administration 6. Renal insufficiency. No hydronephrosis on CT scan of abdomen and pelvis 7. Right apex mass. We'll need to get CT scan of chest. When kidney function improves  Management plans discussed with the patient, patient's son and they are in agreement.   DRUG ALLERGIES:  Allergies  Allergen Reactions  . Penicillins Anaphylaxis  . Lodine [Etodolac]     CODE STATUS:     Code Status Orders        Start     Ordered   08/14/15 [phone]  Do not attempt resuscitation (DNR)   Continuous    Question Answer Comment  In the event of cardiac or respiratory ARREST Do not call a "code blue"   In the event of cardiac or respiratory ARREST Do not perform Intubation, CPR, defibrillation or ACLS   In the event of cardiac or respiratory ARREST Use medication by any route, position, wound care, and other measures to relive pain and suffering. May use oxygen, suction and manual treatment of airway obstruction as needed for comfort.      08/14/15 0841    Advance Directive Documentation         Most Recent Value   Type of Advance Directive  Healthcare Power of Attorney, Living will, Out of facility DNR (pink MOST or yellow form) [POA pt's son - david Heeter]   Pre-existing out of facility DNR order (yellow form or pink MOST form)  Physician notified to receive inpatient order   "MOST" Form in Place?        TOTAL TIME TAKING CARE OF THIS PATIENT: 40  minutes.  Patient's clinical condition, treatment plan as well as discharge planning was discussed with patient's son Spent approximately 10-15  minutes additionally for  Discussion, all questions answered, voiced understanding  Nashea Chumney M.D on 08/14/2015 at 1:33 PM  Between 7am to 6pm - Pager - [phone]  After 6pm go to [email or web address] - password [email or web address] [facility]  [facility]  Office  [phone]  CC: Primary care physician; No primary care provider on file.

## 2015-08-14 NOTE — ED Notes (Addendum)
Pt request that this nurse call Letta Pate, family friend.  This nurse informed Ms. Coble that pt is in ED.  Westley Foots [phone]

## 2015-08-14 NOTE — ED Provider Notes (Signed)
[facility] Emergency Department Provider Note  ____________________________________________  Time seen: Approximately 108 AM  I have reviewed the triage vital signs and the nursing notes.   HISTORY  Chief Complaint Emesis and Diarrhea    HPI Lai T Vorce is a 81 y.o. female who was sent in by her nursing home with nausea vomiting and diarrhea. The patient reports that she could not get off the toilet and she had some left hand and foot numbness since this afternoon. The patient denies any chest pain abdominal pain or back pain. She reports that she vomited 2-3 times and then had diarrhea but was unable to get off of the toilet afterwards. The patient reports that she is unsure what her emesis looked like as she is legally blind. The patient also reports that she is unsure what the diarrhea look like but did mess up her chair. The patient again reports that she is in no pain in the started out of nowhere. The patient was sent in for evaluation and also seemed to have some low oxygen saturation by EMS.   Past Medical History  Diagnosis Date  . Hypertension   . Diabetes mellitus without complication   . Cancer     breast, s/p mastectomy, no recurrence  . Hyperlipidemia   . Subclavian steal syndrome   . Blind   . Colon polyps     There are no active problems to display for this patient.   Past Surgical History  Procedure Laterality Date  . Replacement total knee bilateral    . Total hip arthroplasty Left   . Abdominal hysterectomy    . Mastectomy Right     Current Outpatient Rx  Name  Route  Sig  Dispense  Refill  . aspirin 81 MG tablet   Oral   Take 81 mg by mouth daily.         Marland Kitchen glipiZIDE (GLUCOTROL) 5 MG tablet   Oral   Take 5 mg by mouth daily before breakfast.         . loratadine (CLARITIN) 10 MG tablet   Oral   Take 10 mg by mouth daily.         . Omega-3 Fatty Acids (FISH OIL) 435 MG CAPS   Oral   Take 1 capsule by  mouth daily.         . simvastatin (ZOCOR) 10 MG tablet   Oral   Take 10 mg by mouth daily.         Marland Kitchen lisinopril-hydrochlorothiazide (PRINZIDE,ZESTORETIC) 10-12.5 MG per tablet   Oral   Take 1 tablet by mouth daily.           Allergies Penicillins and Lodine  Family History  Problem Relation Age of Onset  . Colon cancer Brother     Social History Social History  Substance Use Topics  . Smoking status: Never Smoker   . Smokeless tobacco: None  . Alcohol Use: No    Review of Systems Constitutional: No fever/chills Eyes: No visual changes. ENT: No sore throat. Cardiovascular: Denies chest pain. Respiratory: Denies shortness of breath. Gastrointestinal: Vomiting and diarrhea with No abdominal pain.   Genitourinary: Negative for dysuria. Musculoskeletal: Negative for back pain. Skin: Negative for rash. Neurological: Negative for headaches, focal weakness or numbness.  10-point ROS otherwise negative.  ____________________________________________   PHYSICAL EXAM:  VITAL SIGNS: ED Triage Vitals  Enc Vitals Group     BP 08/14/15 0036 127/50 mmHg     Pulse Rate  08/14/15 0036 88     Resp 08/14/15 0036 18     Temp 08/14/15 0036 97.6 F (36.4 C)     Temp Source 08/14/15 0036 Oral     SpO2 08/14/15 0036 100 %     Weight 08/14/15 0036 189 lb 6 oz (85.9 kg)     Height 08/14/15 0036 5\' 4"  (1.626 m)     Head Cir --      Peak Flow --      Pain Score 08/14/15 0038 0     Pain Loc --      Pain Edu? --      Excl. in [place]? --     Constitutional: Alert and oriented. Well appearing and in mild distress. Eyes: Conjunctivae are normal. PERRL. EOMI. Head: Atraumatic. Nose: No congestion/rhinnorhea. Mouth/Throat: Mucous membranes are moist.  Oropharynx non-erythematous. Cardiovascular: Normal rate, regular rhythm. Systolic murmur  Good peripheral circulation. Respiratory: Normal respiratory effort.  No retractions. Lungs CTAB. Gastrointestinal: Soft and nontender. No  distention. Positive bowel sounds Musculoskeletal: No lower extremity tenderness nor edema.  No joint effusions. Neurologic:  Normal speech and language. No gross focal neurologic deficits are appreciated.  Skin:  Skin is warm, dry and intact. No rash noted. Psychiatric: Mood and affect are normal.   ____________________________________________   LABS (all labs ordered are listed, but only abnormal results are displayed)  Labs Reviewed  CBC - Abnormal; Notable for the following:    WBC 17.7 (*)    All other components within normal limits  COMPREHENSIVE METABOLIC PANEL - Abnormal; Notable for the following:    Potassium 6.0 (*)    Glucose, Bld 179 (*)    BUN 28 (*)    Creatinine, Ser 1.32 (*)    GFR calc non Af Amer 34 (*)    GFR calc Af Amer 40 (*)    All other components within normal limits  LACTIC ACID, PLASMA - Abnormal; Notable for the following:    Lactic Acid, Venous 2.5 (*)    All other components within normal limits  URINALYSIS COMPLETEWITH MICROSCOPIC ([facility] ONLY) - Abnormal; Notable for the following:    Color, Urine YELLOW (*)    APPearance HAZY (*)    Glucose, UA 150 (*)    Ketones, ur TRACE (*)    Nitrite POSITIVE (*)    Leukocytes, UA 1+ (*)    Bacteria, UA MANY (*)    Squamous Epithelial / LPF 0-5 (*)    All other components within normal limits  URINE CULTURE  CULTURE, BLOOD (ROUTINE X 2)  CULTURE, BLOOD (ROUTINE X 2)  TROPONIN I  LACTIC ACID, PLASMA   ____________________________________________  EKG  ED ECG REPORT I, Loney Hering, the attending physician, personally viewed and interpreted this ECG.   Date: 08/14/2015  EKG Time: 0041  Rate: 87  Rhythm: normal EKG, normal sinus rhythm  Axis: normal  Intervals:none  ST&T Change: none  ____________________________________________  RADIOLOGY  CT head: Mild generalized atrophy and chronic small vessel changes, no acute findings CXR: Hyperinflation, ill-defined 1 cm opacity in the  right apex. CT abdomen and pelvis: No acute findings are evident in the abdomen or pelvis, hepatic cyst, right renal cyst, hiatal hernia, colonic diverticulosis ____________________________________________   PROCEDURES  Procedure(s) performed: None  Critical Care performed: No  ____________________________________________   INITIAL IMPRESSION / ASSESSMENT AND PLAN / ED COURSE  Pertinent labs & imaging results that were available during my care of the patient were reviewed by me and considered  in my medical decision making (see chart for details).  This is a 81 year old female who comes in today with vomiting diarrhea and nausea. Looking at the patient's blood work she does have an elevated potassium as well as an elevated lactic acid. The patient will receive some normal saline, sodium bicarbonate, insulin and dextrose as well as Kayexalate the patient will be reassessed once her blood work has been received. I will also do a CT scan and a chest x-ray.  Once I received the patient's urinalysis it was discovered the patient has urinary tract infection which may be the explanation for her elevated white blood cell count. The patient receive a dose of levofloxacin and admission to the hospitalist service that she has been vomiting as well. The patient has no further complaints or concerns or be admitted to the hospitalist service. ____________________________________________   FINAL CLINICAL IMPRESSION(S) / ED DIAGNOSES  Final diagnoses:  Acute cystitis without hematuria  Hyperkalemia  Vomiting and diarrhea      Loney Hering, MD 08/14/15 [id number]

## 2015-08-14 NOTE — ED Notes (Signed)
Patient transported to CT 

## 2015-08-15 ENCOUNTER — Encounter: Payer: Self-pay | Admitting: Gastroenterology

## 2015-08-15 ENCOUNTER — Inpatient Hospital Stay: Payer: Medicare Other

## 2015-08-15 LAB — GLUCOSE, CAPILLARY
GLUCOSE-CAPILLARY: 110 mg/dL — AB (ref 65–99)
GLUCOSE-CAPILLARY: 127 mg/dL — AB (ref 65–99)
Glucose-Capillary: 114 mg/dL — ABNORMAL HIGH (ref 65–99)
Glucose-Capillary: 132 mg/dL — ABNORMAL HIGH (ref 65–99)

## 2015-08-15 LAB — BASIC METABOLIC PANEL
ANION GAP: 6 (ref 5–15)
BUN: 19 mg/dL (ref 6–20)
CALCIUM: 8.4 mg/dL — AB (ref 8.9–10.3)
CO2: 28 mmol/L (ref 22–32)
CREATININE: 1.07 mg/dL — AB (ref 0.44–1.00)
Chloride: 105 mmol/L (ref 101–111)
GFR, EST AFRICAN AMERICAN: 51 mL/min — AB (ref >60)
GFR, EST NON AFRICAN AMERICAN: 44 mL/min — AB (ref >60)
Glucose, Bld: 108 mg/dL — ABNORMAL HIGH (ref 65–99)
Potassium: 4.1 mmol/L (ref 3.5–5.1)
SODIUM: 139 mmol/L (ref 135–145)

## 2015-08-15 LAB — CBC
HCT: 34 % — ABNORMAL LOW (ref 35.0–47.0)
Hemoglobin: 11.3 g/dL — ABNORMAL LOW (ref 12.0–16.0)
MCH: 31.4 pg (ref 26.0–34.0)
MCHC: 33.3 g/dL (ref 32.0–36.0)
MCV: 94.2 fL (ref 80.0–100.0)
PLATELETS: 131 10*3/uL — AB (ref 150–440)
RBC: 3.61 MIL/uL — AB (ref 3.80–5.20)
RDW: 14 % (ref 11.5–14.5)
WBC: 9.2 10*3/uL (ref 3.6–11.0)

## 2015-08-15 MED ORDER — ALBUTEROL SULFATE (2.5 MG/3ML) 0.083% IN NEBU: 2.5000 mg | INHALATION_SOLUTION | RESPIRATORY_TRACT | Status: DC | PRN

## 2015-08-15 NOTE — Progress Notes (Signed)
To CT via bed.

## 2015-08-15 NOTE — Progress Notes (Signed)
[facility] [facility] [place] NAME: Kathleen Hampton    MR#:  [id number]  DATE OF BIRTH:  01-Jun-1922  SUBJECTIVE:  CHIEF COMPLAINT:   Chief Complaint  Patient presents with  . Emesis  . Diarrhea   the patient is a 81 year old Caucasian female who presents to the hospital with complaints of nausea, vomiting and diarrhea. She was noted to have urinary tract infection and was admitted for antibiotic therapy. Patient is feeling better today. No nausea, vomiting or diarrhea, no abdominal pain Patient's family reports of nausea, vomiting episodes over the past 6 weeks and  requests gastroenterology consultation. Patient lives in independent living facility. Patient's family requests physical therapist evaluation as well.  Blood cultures and negative urine culture revealed more than 100,000 colony-forming units of gram negative rods. ID to follow . Patient is on levofloxacin .   Review of Systems  Constitutional: Negative for fever, chills and weight loss.  HENT: Negative for congestion.   Eyes: Negative for blurred vision and double vision.  Respiratory: Negative for cough, sputum production, shortness of breath and wheezing.   Cardiovascular: Negative for chest pain, palpitations, orthopnea, leg swelling and PND.  Gastrointestinal: Positive for nausea. Negative for vomiting, abdominal pain, diarrhea, constipation and blood in stool.  Genitourinary: Positive for dysuria, urgency and frequency. Negative for hematuria.  Musculoskeletal: Negative for falls.  Neurological: Negative for dizziness, tremors, focal weakness and headaches.  Endo/Heme/Allergies: Does not bruise/bleed easily.  Psychiatric/Behavioral: Negative for depression. The patient does not have insomnia.     VITAL SIGNS: Blood pressure 119/57, pulse 71, temperature 98.4 F (36.9 C), temperature source Oral, resp. rate 15, height 5\' 4"  (1.626 m), weight 88.225 kg (194 lb 8 oz), SpO2 95  %.  PHYSICAL EXAMINATION:   GENERAL:  81 y.o.-year-old patient lying in the bed with no acute distress.  EYES: Pupils equal, round, reactive to light and accommodation. No scleral icterus. Extraocular muscles intact.  HEENT: Head atraumatic, normocephalic. Oropharynx and nasopharynx clear.  NECK:  Supple, no jugular venous distention. No thyroid enlargement, no tenderness.  LUNGS: Normal breath sounds bilaterally, no wheezing, rales,rhonchi or crepitation. No use of accessory muscles of respiration.  CARDIOVASCULAR: S1, S2 normal. No murmurs, rubs, or gallops.  ABDOMEN: Soft, nontender, nondistended. Bowel sounds present. No organomegaly or mass.  EXTREMITIES: No pedal edema, cyanosis, or clubbing.  NEUROLOGIC: Cranial nerves II through XII are intact. Muscle strength 5/5 in all extremities. Sensation intact. Gait not checked.  PSYCHIATRIC: The patient is alert and oriented x 3.  SKIN: No obvious rash, lesion, or ulcer.   ORDERS/RESULTS REVIEWED:   CBC  Recent Labs Lab 08/14/15 0044 08/14/15 0856 08/15/15 0400  WBC 17.7* 11.2* 9.2  HGB 12.7 11.3* 11.3*  HCT 39.5 34.2* 34.0*  PLT 165 137* 131*  MCV 95.7 93.7 94.2  MCH 30.9 31.0 31.4  MCHC 32.3 33.1 33.3  RDW 13.9 13.7 14.0   ------------------------------------------------------------------------------------------------------------------  Chemistries   Recent Labs Lab 08/14/15 0044 08/14/15 0856 08/15/15 0400  NA 135 138 139  K 6.0* 4.4 4.1  CL 102 105 105  CO2 24 28 28   GLUCOSE 179* 95 108*  BUN 28* 25* 19  CREATININE 1.32* 1.09* 1.07*  CALCIUM 8.9 8.1* 8.4*  MG  --  1.8  --   AST 34  --   --   ALT 14  --   --   ALKPHOS 63  --   --   BILITOT 0.9  --   --    ------------------------------------------------------------------------------------------------------------------  estimated creatinine clearance is 36.8 mL/min (by C-G formula based on Cr of  1.07). ------------------------------------------------------------------------------------------------------------------ No results for input(s): TSH, T4TOTAL, T3FREE, THYROIDAB in the last 72 hours.  Invalid input(s): FREET3  Cardiac Enzymes  Recent Labs Lab 08/14/15 0044  TROPONINI 0.03   ------------------------------------------------------------------------------------------------------------------ Invalid input(s): POCBNP ---------------------------------------------------------------------------------------------------------------  RADIOLOGY: Dg Chest 2 View  08/14/2015   CLINICAL DATA:  Hypoxia, nausea and vomiting.  Onset tonight.  EXAM: CHEST  2 VIEW  COMPARISON:  12/19/2011  FINDINGS: There is mild hyperinflation. There is mild unchanged cardiomegaly. There is ill-defined right apical opacity superimposed on the first anterior rib end, indeterminate. The lungs are otherwise clear. The pulmonary vasculature is normal. There are no pleural effusions. Hilar and mediastinal contours are unremarkable.  IMPRESSION: Hyperinflation.  Ill-defined 1 cm opacity in the right apex appears to be a change from prior studies. Although it could represent superimposition, a small neoplasm is not entirely excluded. If additional imaging is clinically warranted, chest CT would resolve this.   Electronically Signed   By: Andreas Newport M.D.   On: 08/14/2015 02:37   Ct Head Wo Contrast  08/14/2015   CLINICAL DATA:  Hypoxia.  Vomiting.  Upper extremity paresthesias.  EXAM: CT HEAD WITHOUT CONTRAST  TECHNIQUE: Contiguous axial images were obtained from the base of the skull through the vertex without intravenous contrast.  COMPARISON:  None.  FINDINGS: There is no intracranial hemorrhage, mass or evidence of acute infarction. There is mild generalized atrophy. There is mild chronic microvascular ischemic change. There are physiologic basal ganglia calcifications. No focal brain abnormality is evident.  There is no significant extra-axial fluid collection.  No acute intracranial findings are evident. The visible paranasal sinuses are clear.  IMPRESSION: Mild generalized atrophy and chronic small vessel changes. No acute findings.   Electronically Signed   By: Andreas Newport M.D.   On: 08/14/2015 02:34   Ct Abdomen Pelvis W Contrast  08/14/2015   CLINICAL DATA:  Hypoxia.  Nausea and vomiting.  EXAM: CT ABDOMEN AND PELVIS WITH CONTRAST  TECHNIQUE: Multidetector CT imaging of the abdomen and pelvis was performed using the standard protocol following bolus administration of intravenous contrast.  CONTRAST:  11mL OMNIPAQUE IOHEXOL 300 MG/ML  SOLN  COMPARISON:  None.  FINDINGS: Lower chest:  Mild linear scarring in both bases.  Hepatobiliary: There is a 4.4 cm cyst in the left hepatic lobe laterally and a 2.5 cm cyst in the right hepatic lobe posteriorly. There are otherwise unremarkable appearances of the liver and bile ducts.  Pancreas: Mild fatty replacement. No pancreatic mass or duct dilatation. No acute inflammatory changes.  Spleen: Normal except for a splenic granuloma.  Adrenals/Urinary Tract: There is a 1.5 cm cyst at the anterior aspect of the right kidney. No suspicious renal masses are evident. There is no hydronephrosis or ureteral dilatation. No calculi are evident, but sensitivity is reduced due to the presence of intravenous contrast in the collecting systems. Adrenals are normal. Urinary bladder is unremarkable.  Stomach/Bowel: There is a hiatal hernia. The stomach and small bowel are otherwise unremarkable. There is extensive colonic diverticulosis.  Vascular/Lymphatic: The aorta is heavily calcified but normal in caliber.  Reproductive: Unremarkable. Probable hysterectomy. No adnexal abnormalities are evident. Lower pelvic imaging is degraded by metal artifact from the left hip arthroplasty.  Other: No acute inflammatory changes are evident in the abdomen or pelvis. There is no bowel obstruction.  There is no extraluminal air. There is no ascites.  Musculoskeletal: No significant musculoskeletal lesions.  IMPRESSION: 1. No acute findings are evident in the abdomen or pelvis. 2. Hepatic cysts.  Right renal cyst. 3. Hiatal hernia. 4. Colonic diverticulosis.   Electronically Signed   By: Andreas Newport M.D.   On: 08/14/2015 05:40    EKG:  Orders placed or performed during the hospital encounter of 08/14/15  . EKG 12-Lead  . EKG 12-Lead    ASSESSMENT AND PLAN:  Principal Problem:   Sepsis Active Problems:   UTI (lower urinary tract infection)   Hyperkalemia   Type II diabetes mellitus   HTN (hypertension)   HLD (hyperlipidemia)  1. Sepsis due to urinary urine tract infection as well as likely acute gastroenteritis of unclear etiology, resolved on levofloxacin, continue it IV , blood cultures are negative so far,  unable to get stool cultures as patient is not having diarrhea anymore,  urine cultures are pending. However, patient clinically is improving . adjust antibiotic according to culture results 2,. Urinary tract infection with bladder spasms, continue levofloxacin, Pyridium, awaiting for urine cultures. Adjust antibiotics according to culture results as above 3. Acute gastroenteritis, unclear etiology, could be related to infection. patient's family reports recurrence of nausea, vomiting episodes over the past 6 weeks. However, gastroenterologist does not feel that any workup is needed at present. Supportive therapy with IV fluids and antiemetics and antibiotics, get stool cultures  if recurrent 4. Wheezing, no known history of tobacco abuse, unless passive smoking, according to patient's son, resolved now, continue Duo nebs as needed.  5. Hyperkalemia, resolved with IV fluid administration 6. Renal insufficiency. Stable chronic, likely  No hydronephrosis on CT scan of abdomen and pelvis 7. Right apex mass. Get CT scan of chest without contrast.   Management plans discussed  with the patient, patient's son and they are in agreement.   DRUG ALLERGIES:  Allergies  Allergen Reactions  . Penicillins Anaphylaxis  . Lodine [Etodolac]     CODE STATUS:     Code Status Orders        Start     Ordered   08/14/15 [phone]  Do not attempt resuscitation (DNR)   Continuous    Question Answer Comment  In the event of cardiac or respiratory ARREST Do not call a "code blue"   In the event of cardiac or respiratory ARREST Do not perform Intubation, CPR, defibrillation or ACLS   In the event of cardiac or respiratory ARREST Use medication by any route, position, wound care, and other measures to relive pain and suffering. May use oxygen, suction and manual treatment of airway obstruction as needed for comfort.      08/14/15 0841    Advance Directive Documentation        Most Recent Value   Type of Advance Directive  Healthcare Power of Attorney, Living will, Out of facility DNR (pink MOST or yellow form) [POA pt's son - david Yepes]   Pre-existing out of facility DNR order (yellow form or pink MOST form)  Physician notified to receive inpatient order   "MOST" Form in Place?        TOTAL TIME TAKING CARE OF THIS PATIENT: 35 minutes.  Mrs. patient as well as her son and the daughter-in-law , spent approximately 10 additional minutes for discussion  Chelly Dombeck M.D on 08/15/2015 at 1:20 PM  Between 7am to 6pm - Pager - [phone]  After 6pm go to [email or web address] - password [email or web address] [facility]  [facility]  Office  [phone]  CC: Primary care physician; No primary care provider  on file.

## 2015-08-15 NOTE — Progress Notes (Signed)
Back from CT

## 2015-08-15 NOTE — Consult Note (Signed)
GI Inpatient Consult Note  Reason for Consult:   Attending Requesting Consult:  History of Present Illness: Kathleen Hampton is a 81 y.o. female with bouts of nausea, vomiting, and diarrhea prior to admission. Had some urinary symptoms as well. Found to have UTI and is now on Abx. Overall doing better. No abdominal pain, nausea, or diarrhea anymore. No prior hx of PUD or chronic diarrhea. Denies family hx of colon cancer, polyps, or Crohns/colitis.  Past Medical History:  Past Medical History  Diagnosis Date  . Hypertension   . Diabetes mellitus without complication   . Cancer     breast, s/p mastectomy, no recurrence  . Hyperlipidemia   . Subclavian steal syndrome   . Blind   . Colon polyps     Problem List: Patient Active Problem List   Diagnosis Date Noted  . Sepsis 08/14/2015  . UTI (lower urinary tract infection) 08/14/2015  . Hyperkalemia 08/14/2015  . Type II diabetes mellitus 08/14/2015  . HTN (hypertension) 08/14/2015  . HLD (hyperlipidemia) 08/14/2015    Past Surgical History: Past Surgical History  Procedure Laterality Date  . Replacement total knee bilateral    . Total hip arthroplasty Left   . Abdominal hysterectomy    . Mastectomy Right     Allergies: Allergies  Allergen Reactions  . Penicillins Anaphylaxis  . Lodine [Etodolac]     Home Medications: Prescriptions prior to admission  Medication Sig Dispense Refill Last Dose  . aspirin 81 MG tablet Take 81 mg by mouth daily.     Marland Kitchen glipiZIDE (GLUCOTROL) 5 MG tablet Take 5 mg by mouth daily before breakfast.     . loratadine (CLARITIN) 10 MG tablet Take 10 mg by mouth daily.     . Omega-3 Fatty Acids (FISH OIL) 435 MG CAPS Take 1 capsule by mouth daily.     . simvastatin (ZOCOR) 10 MG tablet Take 10 mg by mouth daily.     Marland Kitchen lisinopril-hydrochlorothiazide (PRINZIDE,ZESTORETIC) 10-12.5 MG per tablet Take 1 tablet by mouth daily.      Home medication reconciliation was completed with the patient.    Scheduled Inpatient Medications:   . aspirin EC  81 mg Oral Daily  . budesonide-formoterol  2 puff Inhalation BID  . heparin  5,000 Units Subcutaneous 3 times per day  . insulin aspart  0-5 Units Subcutaneous QHS  . insulin aspart  0-9 Units Subcutaneous TID WC  . levofloxacin (LEVAQUIN) IV  250 mg Intravenous Q24H  . phenazopyridine  100 mg Oral TID WC  . prednisoLONE acetate  1 drop Left Eye Daily  . simvastatin  10 mg Oral Daily  . tiotropium  18 mcg Inhalation Daily    Continuous Inpatient Infusions:     PRN Inpatient Medications:  acetaminophen **OR** acetaminophen, albuterol, ondansetron **OR** ondansetron (ZOFRAN) IV, sodium chloride  Family History: family history includes Colon cancer in her brother.  The patient's family history is negative for inflammatory bowel disorders, GI malignancy, or solid organ transplantation.  Social History:   reports that she has never smoked. She does not have any smokeless tobacco history on file. She reports that she does not drink alcohol or use illicit drugs. The patient denies ETOH, tobacco, or drug use.   Review of Systems: Constitutional: Weight is stable.  Eyes: No changes in vision. ENT: No oral lesions, sore throat.  GI: see HPI.  Heme/Lymph: No easy bruising.  CV: No chest pain.  GU: No hematuria. Dysuria and urgency. Integumentary: No rashes.  Neuro: No headaches.  Psych: No depression/anxiety.  Endocrine: No heat/cold intolerance.  Allergic/Immunologic: No urticaria.  Resp: No cough, SOB.  Musculoskeletal: No joint swelling.    Physical Examination: BP 119/57 mmHg  Pulse 71  Temp(Src) 98.4 F (36.9 C) (Oral)  Resp 15  Ht 5\' 4"  (1.626 m)  Wt 88.225 kg (194 lb 8 oz)  BMI 33.37 kg/m2  SpO2 95% Gen: NAD, alert and oriented x 4 HEENT: PEERLA, EOMI, Neck: supple, no JVD or thyromegaly Chest: CTA bilaterally, no wheezes, crackles, or other adventitious sounds CV: RRR, no m/g/c/r Abd: soft, NT, ND, +BS in all  four quadrants; no HSM, guarding, ridigity, or rebound tenderness Ext: no edema, well perfused with 2+ pulses, Skin: no rash or lesions noted Lymph: no LAD  Data: Lab Results  Component Value Date   WBC 9.2 08/15/2015   HGB 11.3* 08/15/2015   HCT 34.0* 08/15/2015   MCV 94.2 08/15/2015   PLT 131* 08/15/2015    Recent Labs Lab 08/14/15 0044 08/14/15 0856 08/15/15 0400  HGB 12.7 11.3* 11.3*   Lab Results  Component Value Date   NA 139 08/15/2015   K 4.1 08/15/2015   CL 105 08/15/2015   CO2 28 08/15/2015   BUN 19 08/15/2015   CREATININE 1.07* 08/15/2015   Lab Results  Component Value Date   ALT 14 08/14/2015   AST 34 08/14/2015   ALKPHOS 63 08/14/2015   BILITOT 0.9 08/14/2015   No results for input(s): APTT, INR, PTT in the last 168 hours. Assessment/Plan: Kathleen Hampton is a 81 y.o. female with GI symptoms, which has since resolved. Either she had viral gastroenteritis or her symptoms were related to her sepsis. Stable GI-wise. Recommendations: No further GI recommendations since pt has no GI symptoms now. Will sign off.  Thank you for the consult. Please call with questions or concerns.  Teriana Danker, Lupita Dawn, MD

## 2015-08-16 LAB — GLUCOSE, CAPILLARY
GLUCOSE-CAPILLARY: 116 mg/dL — AB (ref 65–99)
Glucose-Capillary: 120 mg/dL — ABNORMAL HIGH (ref 65–99)
Glucose-Capillary: 134 mg/dL — ABNORMAL HIGH (ref 65–99)
Glucose-Capillary: 142 mg/dL — ABNORMAL HIGH (ref 65–99)

## 2015-08-16 LAB — URINE CULTURE: SPECIAL REQUESTS: NORMAL

## 2015-08-16 MED ORDER — LEVOFLOXACIN 250 MG PO TABS
250.0000 mg | ORAL_TABLET | Freq: Every day | ORAL | Status: DC
  Administered 2015-08-17: 250 mg via ORAL
  Filled 2015-08-16: qty 1

## 2015-08-16 MED ORDER — ORPHENADRINE CITRATE ER 100 MG PO TB12
100.0000 mg | ORAL_TABLET | Freq: Two times a day (BID) | ORAL | Status: DC
  Administered 2015-08-16 – 2015-08-17 (×4): 100 mg via ORAL
  Filled 2015-08-16 (×4): qty 1

## 2015-08-16 NOTE — Care Management (Signed)
Physical therapy is recommending patient's functional status is sufficient to return to her home .  Patiewnt's family members were present during the evaluation.  Have attempted to call patient's son but no answer and not able to leave a voicemail

## 2015-08-16 NOTE — Progress Notes (Signed)
[facility] [place] NAME: Kathleen Hampton    MR#:  [id number]  DATE OF BIRTH:  October 30, 1922  SUBJECTIVE:  CHIEF COMPLAINT:   Chief Complaint  Patient presents with  . Emesis  . Diarrhea   the patient is a 81 year old Caucasian female who presents to the hospital with complaints of nausea, vomiting and diarrhea. She was noted to have urinary tract infection and was admitted for antibiotic therapy. Patient is feeling better today. No nausea, vomiting or diarrhea, no abdominal pain Patient's family reports of nausea, vomiting episodes over the past 6 weeks and  requests gastroenterology consultation. Patient lives in independent living facility. Patient's family requests physical therapist evaluation as well.  Blood cultures and negative urine culture revealed more than 100,000 colony-forming units of gram negative rods. ID to follow . Patient is on levofloxacin .  Today, patient is complaining of muscle spasms in the left lower back area. States that these muscle spasms occurred . When she arrived to the hospital. Got up to the bathroom today with the walker, but fell down backwards. No injuries reported by nursing staff.   Review of Systems  Constitutional: Negative for fever, chills and weight loss.  HENT: Negative for congestion.   Eyes: Negative for blurred vision and double vision.  Respiratory: Negative for cough, sputum production, shortness of breath and wheezing.   Cardiovascular: Negative for chest pain, palpitations, orthopnea, leg swelling and PND.  Gastrointestinal: Positive for nausea. Negative for vomiting, abdominal pain, diarrhea, constipation and blood in stool.  Genitourinary: Positive for dysuria, urgency and frequency. Negative for hematuria.  Musculoskeletal: Negative for falls.  Neurological: Negative for dizziness, tremors, focal weakness and headaches.  Endo/Heme/Allergies: Does not bruise/bleed easily.   Psychiatric/Behavioral: Negative for depression. The patient does not have insomnia.     VITAL SIGNS: Blood pressure 131/78, pulse 69, temperature 97.6 F (36.4 C), temperature source Oral, resp. rate 19, height 5\' 4"  (1.626 m), weight 88.225 kg (194 lb 8 oz), SpO2 90 %.  PHYSICAL EXAMINATION:   GENERAL:  81 y.o.-year-old patient lying in the bed with no acute distress. Intermittently grabbing her left lower back when. She moves EYES: Pupils equal, round, reactive to light and accommodation. No scleral icterus. Extraocular muscles intact.  HEENT: Head atraumatic, normocephalic. Oropharynx and nasopharynx clear.  NECK:  Supple, no jugular venous distention. No thyroid enlargement, no tenderness.  LUNGS: Normal breath sounds bilaterally, no wheezing, rales,rhonchi or crepitation. No use of accessory muscles of respiration.  CARDIOVASCULAR: S1, S2 normal. No murmurs, rubs, or gallops.  ABDOMEN: Soft, nontender, nondistended. Bowel sounds present. No organomegaly or mass.  EXTREMITIES: No pedal edema, cyanosis, or clubbing.  NEUROLOGIC: Cranial nerves II through XII are intact. Muscle strength 5/5 in all extremities. Sensation intact. Gait not checked.  PSYCHIATRIC: The patient is alert and oriented x 3.  SKIN: No obvious rash, lesion, or ulcer.   ORDERS/RESULTS REVIEWED:   CBC  Recent Labs Lab 08/14/15 0044 08/14/15 0856 08/15/15 0400  WBC 17.7* 11.2* 9.2  HGB 12.7 11.3* 11.3*  HCT 39.5 34.2* 34.0*  PLT 165 137* 131*  MCV 95.7 93.7 94.2  MCH 30.9 31.0 31.4  MCHC 32.3 33.1 33.3  RDW 13.9 13.7 14.0   ------------------------------------------------------------------------------------------------------------------  Chemistries   Recent Labs Lab 08/14/15 0044 08/14/15 0856 08/15/15 0400  NA 135 138 139  K 6.0* 4.4 4.1  CL 102 105 105  CO2 24 28 28   GLUCOSE 179* 95 108*  BUN 28* 25*  19  CREATININE 1.32* 1.09* 1.07*  CALCIUM 8.9 8.1* 8.4*  MG  --  1.8  --   AST 34  --    --   ALT 14  --   --   ALKPHOS 63  --   --   BILITOT 0.9  --   --    ------------------------------------------------------------------------------------------------------------------ estimated creatinine clearance is 36.8 mL/min (by C-G formula based on Cr of 1.07). ------------------------------------------------------------------------------------------------------------------ No results for input(s): TSH, T4TOTAL, T3FREE, THYROIDAB in the last 72 hours.  Invalid input(s): FREET3  Cardiac Enzymes  Recent Labs Lab 08/14/15 0044  TROPONINI 0.03   ------------------------------------------------------------------------------------------------------------------ Invalid input(s): POCBNP ---------------------------------------------------------------------------------------------------------------  RADIOLOGY: Ct Chest Wo Contrast  08/15/2015   CLINICAL DATA:  81 year old female with abnormal chest x-ray. History of breast cancer status post right mastectomy.  EXAM: CT CHEST WITHOUT CONTRAST  TECHNIQUE: Multidetector CT imaging of the chest was performed following the standard protocol without IV contrast.  COMPARISON:  Chest x-ray 08/14/2015.  No prior chest CT.  FINDINGS: Mediastinum/Lymph Nodes: Heart size is normal. There is no significant pericardial fluid, thickening or pericardial calcification. There is atherosclerosis of the thoracic aorta, the great vessels of the mediastinum and the coronary arteries, including calcified atherosclerotic plaque in the left main, left anterior descending, left circumflex and right coronary arteries. Severe calcifications of the aortic valve. No pathologically enlarged mediastinal, internal mammary or hilar lymph nodes. Please note that accurate exclusion of hilar adenopathy is limited on noncontrast CT scans. Esophagus is unremarkable in appearance. No axillary lymphadenopathy.  Lungs/Pleura: The opacity in the right apex on the recent chest x-ray  corresponds to prominent mediastinal or vascular structures. No suspicious appearing pulmonary nodules or masses are noted on today's examination. No acute consolidative airspace disease. No pleural effusions. Scattered areas of linear scarring are noted throughout the lungs bilaterally, most evident in the basal segments of the lower lobes of the lungs bilaterally.  Upper Abdomen: See report for CT the abdomen and pelvis with IV contrast 08/14/2015 for full description of upper abdominal findings.  Musculoskeletal/Soft Tissues: There are no aggressive appearing lytic or blastic lesions noted in the visualized portions of the skeleton. Status post right modified radical mastectomy. Collapsed subpectoral breast implant incidentally noted.  IMPRESSION: 1. No suspicious appearing pulmonary nodules or masses. The findings on the recent chest radiograph corresponds to prominent mediastinal vascular structures. 2. Status post right modified radical mastectomy and subpectoral breast implant. The subpectoral breast implant appears collapsed. 3. Atherosclerosis, including left main and 3 vessel coronary artery disease. 4. There are calcifications of the aortic valve. Echocardiographic correlation for evaluation of potential valvular dysfunction may be warranted if clinically indicated. 5. Mild cardiomegaly. 6. Additional incidental findings, as above.   Electronically Signed   By: Vinnie Langton M.D.   On: 08/15/2015 15:33    EKG:  Orders placed or performed during the hospital encounter of 08/14/15  . EKG 12-Lead  . EKG 12-Lead    ASSESSMENT AND PLAN:  Principal Problem:   Sepsis Active Problems:   UTI (lower urinary tract infection)   Hyperkalemia   Type II diabetes mellitus   HTN (hypertension)   HLD (hyperlipidemia)  1. Sepsis due to urinary urine tract infection as well as likely acute gastroenteritis of unclear etiology, resolved on levofloxacin, continue it , changed to oral, blood cultures are  negative so far,  unable to get stool cultures as patient is not having diarrhea anymore,  urine cultures are growing Escherichia coli, pansensitive.  2,. Urinary tract  infection due to Escherichia coli pansensitive with bladder spasms, continue levofloxacin, discontinue Pyridium 3. Acute gastroenteritis, possibly related to urinary tract infection. The patient was seen by gastroenterologist. However gastroenterologist did not feel that any workup is needed at present. Discontinue IV fluids and continue oral diet  4. Wheezing, no known history of tobacco abuse, unless passive smoking, according to patient's son, resolved now, continue Duo nebs as needed.  5. Hyperkalemia, resolved with IV fluid administration, dehydration related 6. Renal insufficiency. Stable chronic, likely  No hydronephrosis on CT scan of abdomen and pelvis 7. Right apex mass. Get CT scan of chest without contrast showed no suspicious appearing pulmonary nodules or masses and it was likely mediastinal vascular structure.  8. Lower back pain, likely muscle spasm. Initiate patient on Norflex as well as physical therapy  Management plans discussed with the patient, patient's son and they are in agreement.   DRUG ALLERGIES:  Allergies  Allergen Reactions  . Penicillins Anaphylaxis  . Lodine [Etodolac]     CODE STATUS:     Code Status Orders        Start     Ordered   08/14/15 [phone]  Do not attempt resuscitation (DNR)   Continuous    Question Answer Comment  In the event of cardiac or respiratory ARREST Do not call a "code blue"   In the event of cardiac or respiratory ARREST Do not perform Intubation, CPR, defibrillation or ACLS   In the event of cardiac or respiratory ARREST Use medication by any route, position, wound care, and other measures to relive pain and suffering. May use oxygen, suction and manual treatment of airway obstruction as needed for comfort.      08/14/15 0841    Advance Directive Documentation         Most Recent Value   Type of Advance Directive  Healthcare Power of Attorney, Living will, Out of facility DNR (pink MOST or yellow form) [POA pt's son - david Weill]   Pre-existing out of facility DNR order (yellow form or pink MOST form)  Physician notified to receive inpatient order   "MOST" Form in Place?        TOTAL TIME TAKING CARE OF THIS PATIENT: 35 minutes.   Discharge planning was discussed with care management as well as nursing staff, although since patient fell down, we will delay patient is discharged to home till tomorrow Ermie Glendenning M.D on 08/16/2015 at 2:37 PM  Between 7am to 6pm - Pager - [phone]  After 6pm go to [email or web address] - password [email or web address] [facility]  [facility]  Office  [phone]  CC: Primary care physician; No primary care provider on file.

## 2015-08-16 NOTE — Care Management (Signed)
Patient presents from [facility].  She is legally blind .  Says that she never walks without her walker and is able to ambulate by herself to the dining room. Started experiencing nausea and vomiting three days prior to admission and became profoundly weak.  She is now complaining of a sudden onset of a "new sharp pain" at her waist line on the left side of her back near her spine.    Says that she does not sleep in her bed.  Sleeps in her recliner because it is more comfortable.  She denies need for a hospital bed.  It is reported that patient sustained a fall this morning.  Updated primary nurse regarding patient's pain complaint.  There is a physical therapy consult pending.  Have spoken with patient's son [name].  Family is voicing concerns as to whether patient can continue to live on her own

## 2015-08-16 NOTE — Progress Notes (Addendum)
Pt son notified of pt's fall without injury.  Pt has been c/o back pain (chronic) prior to fall, pt denies any increase in same.  Pt denies any pain or discomfort, no abrasions or skin involvement. Pt A&O x 4 , VSS, and  SR per monitor prior to and s/p fall.  Pt in NAD, skin warm and dry. Continued monitoring.

## 2015-08-16 NOTE — Clinical Social Work Note (Signed)
Clinical Social Work Assessment  Patient Details  Name: Kathleen Hampton MRN: [id number] Date of Birth: 12-24-1921  Date of referral:  08/16/15               Reason for consult:  Facility Placement (pt is from [place] living.)                Permission sought to share information with:  Family Supports, Chartered certified accountant granted to share information::  Yes, Verbal Permission Granted  Name::     Son [name]  Agency::  [employer]  Relationship::     Contact Information:     Housing/Transportation Living arrangements for the past 2 months:  Charity fundraiser of Information:  Patient Patient Interpreter Needed:  None Criminal Activity/Legal Involvement Pertinent to Current Situation/Hospitalization:  No - Comment as needed Significant Relationships:  Adult Children Lives with:  Self Do you feel safe going back to the place where you live?  Yes Need for family participation in patient care:  Yes (Comment)  Care giving concerns: None expressed at this time.   Social Worker assessment / plan: CSW spoke to pt.  She was fully oriented and alert.  She confirmed that she was from [facility] living.  She was in agreement with returning once medically stable.  Pt is blind but has is currently living alone.  CSW spoke to RN and pt is able to use a walker fairly well in the room.  No CSW needs identified at this time.    Employment status:  Retired, Disabled (Comment on whether or not currently receiving Disability) Insurance information:  Medicare PT Recommendations:    Information / Referral to community resources:     Patient/Family's Response to care:  Pt in agreement with return to [facility]  Patient/Family's Understanding of and Emotional Response to Diagnosis, Current Treatment, and Prognosis:  Pt in agreement with returning to [facility] and verbalized her understanding of this DC plan.    Emotional  Assessment Appearance:  Appears stated age Attitude/Demeanor/Rapport:   (polite) Affect (typically observed):  Agitated (pt's back was hurting her at time of assessment.) Orientation:  Oriented to Self, Oriented to Place, Oriented to  Time Alcohol / Substance use:  Never Used Psych involvement (Current and /or in the community):  No (Comment)  Discharge Needs  Concerns to be addressed:  No discharge needs identified Readmission within the last 30 days:  No Current discharge risk:  Physical Impairment Barriers to Discharge:  Continued Medical Work up   [employer], LCSW 08/16/2015, 11:12 AM

## 2015-08-16 NOTE — Care Management Important Message (Signed)
Important Message  Patient Details  Name: Kathleen Hampton MRN: [id number] Date of Birth: 1922/09/03   Medicare Important Message Given:  Yes-second notification given    Katrina Stack, RN 08/16/2015, 8:30 AM

## 2015-08-16 NOTE — Evaluation (Signed)
Physical Therapy Evaluation Patient Details Name: Kathleen Hampton MRN: [id number] DOB: [birth year] Today's Date: 08/16/2015   History of Present Illness  Pt here with UTI, had nausea/vomiting that has resolved  Clinical Impression  Pt is able to ambulate with safety and confidence though she does fatigue.  Pt is interested in HHPT and is able to have her meals delivered for a while.  There is also a group exercises class that she wants to start going to as well.        Follow Up Recommendations Home health PT    Equipment Recommendations       Recommendations for Other Services       Precautions / Restrictions Precautions Precautions: Fall Restrictions Weight Bearing Restrictions: No      Mobility  Bed Mobility Overal bed mobility:  (pt in recliner on arrival)                Transfers Overall transfer level: Modified independent Equipment used: Rolling walker (2 wheeled)             General transfer comment: Pt is able to slowly rise to standing with good confidence  Ambulation/Gait Ambulation/Gait assistance: Min guard Ambulation Distance (Feet): 40 Feet Assistive device: Rolling walker (2 wheeled)       General Gait Details: Pt is slow, but steady with ambualtion and though she is slower than her baseline (RW vs 4WW) she is safe and able to maintain balance. She does fatigue with the effort.  Stairs            Wheelchair Mobility    Modified Rankin (Stroke Patients Only)       Balance                                             Pertinent Vitals/Pain Pain Assessment:  (chronic back pain)    Home Living Family/patient expects to be discharged to::  (Independent living)               Home Equipment: Walker - 4 wheels Additional Comments: Pt walks ~200 ft to dining room 3x/day, can have meals brought    Prior Function Level of Independence: Independent with assistive device(s)         Comments: Pt is able  to ambulate well and safely with rollator     Hand Dominance        Extremity/Trunk Assessment   Upper Extremity Assessment: Overall WFL for tasks assessed           Lower Extremity Assessment: Overall WFL for tasks assessed         Communication   Communication: No difficulties  Cognition Arousal/Alertness: Awake/alert Behavior During Therapy: WFL for tasks assessed/performed Overall Cognitive Status: Within Functional Limits for tasks assessed                      General Comments      Exercises        Assessment/Plan    PT Assessment Patient needs continued PT services  PT Diagnosis Difficulty walking;Generalized weakness   PT Problem List Decreased strength;Decreased activity tolerance;Decreased balance;Decreased mobility;Decreased coordination;Decreased safety awareness  PT Treatment Interventions DME instruction;Gait training;Therapeutic activities;Functional mobility training;Therapeutic exercise;Balance training;Neuromuscular re-education   PT Goals (Current goals can be found in the Care Plan section) Acute Rehab PT Goals Patient Stated Goal: "I want to  go back home it I can" PT Goal Formulation: With patient/family Time For Goal Achievement: 08/30/15 Potential to Achieve Goals: Good    Frequency Min 2X/week   Barriers to discharge        Co-evaluation               End of Session Equipment Utilized During Treatment: Gait belt Activity Tolerance: Patient limited by fatigue Patient left: with chair alarm set           Time: 0677-0340 PT Time Calculation (min) (ACUTE ONLY): 25 min   Charges:   PT Evaluation $Initial PT Evaluation Tier I: 1 Procedure     PT G Codes:       Wayne Both, PT, DPT [phone]  Kreg Shropshire 08/16/2015, 5:48 PM

## 2015-08-16 NOTE — Clinical Social Work Note (Signed)
Pt is from ID living.  No placement needs at this time.  CSW signing off unless further needs arise.

## 2015-08-17 DIAGNOSIS — K529 Noninfective gastroenteritis and colitis, unspecified: Secondary | ICD-10-CM

## 2015-08-17 DIAGNOSIS — E860 Dehydration: Secondary | ICD-10-CM

## 2015-08-17 LAB — GLUCOSE, CAPILLARY
GLUCOSE-CAPILLARY: 116 mg/dL — AB (ref 65–99)
Glucose-Capillary: 152 mg/dL — ABNORMAL HIGH (ref 65–99)

## 2015-08-17 LAB — EXPECTORATED SPUTUM ASSESSMENT W GRAM STAIN, RFLX TO RESP C: Special Requests: NORMAL

## 2015-08-17 LAB — EXPECTORATED SPUTUM ASSESSMENT W REFEX TO RESP CULTURE

## 2015-08-17 MED ORDER — LEVOFLOXACIN 250 MG PO TABS
250.0000 mg | ORAL_TABLET | Freq: Every day | ORAL | Status: DC
Start: 2015-08-17 — End: 2017-10-05

## 2015-08-17 MED ORDER — MORPHINE SULFATE (PF) 2 MG/ML IV SOLN
1.0000 mg | INTRAVENOUS | Status: DC | PRN
  Filled 2015-08-17: qty 1

## 2015-08-17 NOTE — Progress Notes (Signed)
Patient  has been complaining of back pain off and on. No PRN pain med except Tylenol 650 mg. Patient had just received Tylenol 650 mg for mild discomfort. The RN notified Dr. Marcille Blanco to put on PRN pain med incase the Tylenol will   not relieve  her pain.  New order for Morphine 1 mg every 4 hours was put in by Dr Marcille Blanco. Will continue to monitor.

## 2015-08-17 NOTE — Discharge Summary (Signed)
[facility] [place] [facility] NAME: Kathleen Hampton    MR#:  [id number]  DATE OF BIRTH:  1922-01-19  DATE OF ADMISSION:  08/14/2015 ADMITTING PHYSICIAN: Lance Coon, MD  DATE OF DISCHARGE: No discharge date for patient encounter.  PRIMARY CARE PHYSICIAN: No primary care provider on file.     ADMISSION DIAGNOSIS:  Hyperkalemia [E87.5] Acute cystitis without hematuria [N30.00] Vomiting and diarrhea [R11.10, R19.7]  DISCHARGE DIAGNOSIS:  Principal Problem:   Sepsis Active Problems:   UTI (lower urinary tract infection)   Hyperkalemia   Dehydration   Type II diabetes mellitus   HTN (hypertension)   HLD (hyperlipidemia)   Acute gastroenteritis   SECONDARY DIAGNOSIS:   Past Medical History  Diagnosis Date  . Hypertension   . Diabetes mellitus without complication   . Cancer     breast, s/p mastectomy, no recurrence  . Hyperlipidemia   . Subclavian steal syndrome   . Blind   . Colon polyps     .pro HOSPITAL COURSE:   Patient is 81 year old Caucasian female with past nuchal history significant for history of diabetes, hypertension, hyperlipidemia, being blind present to the hospital with complaints of nausea, vomiting and diarrhea as well as weakness. In emergency room, patient was noted to have mild renal insufficiency as well as hyperkalemia with potassium level of 6.0. Patient's white blood cell count was elevated at 17.7 and her lactic acid level was high at 2.2. Patient's urinalysis showed pyuria.  She was admitted to the hospital with sepsis diagnosis. Patient was initiated on antibiotic therapy with levofloxacin. Blood cultures were taken and they showed no growth. Urine culture, however, revealed more than 100,000 colony-forming units of Escherichia coli pansensitive. Patient was continued on antibiotic therapy and her condition improved.  Discussion by problem  1. Sepsis due to urinary urine tract infection as well as likely  acute gastroenteritis of unclear etiology, resolved on levofloxacin, continue it for 3 more days to complete course, blood cultures are negative so far, unable to get stool cultures as patient is not having diarrhea anymore, urine cultures are growing Escherichia coli, pansensitive.  2,. Urinary tract infection due to Escherichia coli pansensitive with bladder spasms, continue levofloxacin, of Pyridium, clinically stable 3. Acute gastroenteritis, possibly related to urinary tract infection. The patient was seen by gastroenterologist. However gastroenterologist did not feel that any workup is needed at present. Of IV fluids and good oral intake   4. Wheezing, no known history of tobacco abuse, unless passive smoking, according to patient's son, resolved now 5. Hyperkalemia, resolved with IV fluid administration, dehydration related 6. Renal insufficiency. Stable chronic, likely No hydronephrosis on CT scan of abdomen and pelvis, follow-up as outpatient, since patient is on lisinopril 7. Right apex mass. CT scan of chest without contrast showed no suspicious appearing pulmonary nodules or masses and it was likely mediastinal vascular structure. Asymptomatic 8. Lower back pain, likely muscle spasm. Given few doses of Norflex, resolved. Normal complaints. Continue physical therapy, Tylenol as needed. Arranging home health physical therapy for home  DISCHARGE CONDITIONS:   Fair  CONSULTS OBTAINED:     DRUG ALLERGIES:   Allergies  Allergen Reactions  . Penicillins Anaphylaxis  . Lodine [Etodolac]     DISCHARGE MEDICATIONS:   Current Discharge Medication List    START taking these medications   Details  levofloxacin (LEVAQUIN) 250 MG tablet Take 1 tablet (250 mg total) by mouth daily. Qty: 3 tablet, Refills: 0  CONTINUE these medications which have NOT CHANGED   Details  aspirin 81 MG tablet Take 81 mg by mouth daily.    glipiZIDE (GLUCOTROL) 5 MG tablet Take 5 mg by mouth  daily before breakfast.    loratadine (CLARITIN) 10 MG tablet Take 10 mg by mouth daily.    Omega-3 Fatty Acids (FISH OIL) 435 MG CAPS Take 1 capsule by mouth daily.    simvastatin (ZOCOR) 10 MG tablet Take 10 mg by mouth daily.    lisinopril-hydrochlorothiazide (PRINZIDE,ZESTORETIC) 10-12.5 MG per tablet Take 1 tablet by mouth daily.         DISCHARGE INSTRUCTIONS:    Patient is to follow-up with primary care physician, Drs making the house calls as outpatient.   If you experience worsening of your admission symptoms, develop shortness of breath, life threatening emergency, suicidal or homicidal thoughts you must seek medical attention immediately by calling 911 or calling your MD immediately  if symptoms less severe.  You Must read complete instructions/literature along with all the possible adverse reactions/side effects for all the Medicines you take and that have been prescribed to you. Take any new Medicines after you have completely understood and accept all the possible adverse reactions/side effects.   Please note  You were cared for by a hospitalist during your hospital stay. If you have any questions about your discharge medications or the care you received while you were in the hospital after you are discharged, you can call the unit and asked to speak with the hospitalist on call if the hospitalist that took care of you is not available. Once you are discharged, your primary care physician will handle any further medical issues. Please note that NO REFILLS for any discharge medications will be authorized once you are discharged, as it is imperative that you return to your primary care physician (or establish a relationship with a primary care physician if you do not have one) for your aftercare needs so that they can reassess your need for medications and monitor your lab values.    Today   CHIEF COMPLAINT:   Chief Complaint  Patient presents with  . Emesis  .  Diarrhea    HISTORY OF PRESENT ILLNESS:  Kathleen Hampton  is a 81 y.o. female with a known history of diabetes, hypertension, hyperlipidemia, being blind present to the hospital with complaints of nausea, vomiting and diarrhea as well as weakness. In emergency room, patient was noted to have mild renal insufficiency as well as hyperkalemia with potassium level of 6.0. Patient's white blood cell count was elevated at 17.7 and her lactic acid level was high at 2.2. Patient's urinalysis showed pyuria.  She was admitted to the hospital with sepsis diagnosis. Patient was initiated on antibiotic therapy with levofloxacin. Blood cultures were taken and they showed no growth. Urine culture, however, revealed more than 100,000 colony-forming units of Escherichia coli pansensitive. Patient was continued on antibiotic therapy and her condition improved.  Discussion by problem  1. Sepsis due to urinary urine tract infection as well as likely acute gastroenteritis of unclear etiology, resolved on levofloxacin, continue it for 3 more days to complete course, blood cultures are negative so far, unable to get stool cultures as patient is not having diarrhea anymore, urine cultures are growing Escherichia coli, pansensitive.  2,. Urinary tract infection due to Escherichia coli pansensitive with bladder spasms, continue levofloxacin, of Pyridium, clinically stable 3. Acute gastroenteritis, possibly related to urinary tract infection. The patient was seen by gastroenterologist. However  gastroenterologist did not feel that any workup is needed at present. Of IV fluids and good oral intake   4. Wheezing, no known history of tobacco abuse, unless passive smoking, according to patient's son, resolved now 5. Hyperkalemia, resolved with IV fluid administration, dehydration related 6. Renal insufficiency. Stable chronic, likely No hydronephrosis on CT scan of abdomen and pelvis, follow-up as outpatient, since patient is on  lisinopril 7. Right apex mass. CT scan of chest without contrast showed no suspicious appearing pulmonary nodules or masses and it was likely mediastinal vascular structure. Asymptomatic 8. Lower back pain, likely muscle spasm. Given few doses of Norflex, resolved. Normal complaints. Continue physical therapy, Tylenol as needed. Arranging home health physical therapy for home   VITAL SIGNS:  Blood pressure 152/67, pulse 86, temperature 97.8 F (36.6 C), temperature source Oral, resp. rate 20, height 5\' 4"  (1.626 m), weight 88.225 kg (194 lb 8 oz), SpO2 98 %.  I/O:   Intake/Output Summary (Last 24 hours) at 08/17/15 1438 Last data filed at 08/17/15 1134  Gross per 24 hour  Intake    600 ml  Output    650 ml  Net    -50 ml    PHYSICAL EXAMINATION:  GENERAL:  81 y.o.-year-old patient lying in the bed with no acute distress.  EYES: Pupils equal, round, reactive to light and accommodation. No scleral icterus. Extraocular muscles intact.  HEENT: Head atraumatic, normocephalic. Oropharynx and nasopharynx clear.  NECK:  Supple, no jugular venous distention. No thyroid enlargement, no tenderness.  LUNGS: Normal breath sounds bilaterally, no wheezing, rales,rhonchi or crepitation. No use of accessory muscles of respiration.  CARDIOVASCULAR: S1, S2 normal. No murmurs, rubs, or gallops.  ABDOMEN: Soft, non-tender, non-distended. Bowel sounds present. No organomegaly or mass.  EXTREMITIES: No pedal edema, cyanosis, or clubbing.  NEUROLOGIC: Cranial nerves II through XII are intact. Muscle strength 5/5 in all extremities. Sensation intact. Gait not checked.  PSYCHIATRIC: The patient is alert and oriented x 3.  SKIN: No obvious rash, lesion, or ulcer.   DATA REVIEW:   CBC  Recent Labs Lab 08/15/15 0400  WBC 9.2  HGB 11.3*  HCT 34.0*  PLT 131*    Chemistries   Recent Labs Lab 08/14/15 0044 08/14/15 0856 08/15/15 0400  NA 135 138 139  K 6.0* 4.4 4.1  CL 102 105 105  CO2 24 28  28   GLUCOSE 179* 95 108*  BUN 28* 25* 19  CREATININE 1.32* 1.09* 1.07*  CALCIUM 8.9 8.1* 8.4*  MG  --  1.8  --   AST 34  --   --   ALT 14  --   --   ALKPHOS 63  --   --   BILITOT 0.9  --   --     Cardiac Enzymes  Recent Labs Lab 08/14/15 0044  TROPONINI 0.03    Microbiology Results  Results for orders placed or performed during the hospital encounter of 08/14/15  Urine culture     Status: None   Collection Time: 08/14/15  3:24 AM  Result Value Ref Range Status   Specimen Description URINE, CATHETERIZED  Final   Special Requests Normal  Final   Culture >=100,000 COLONIES/mL ESCHERICHIA COLI  Final   Report Status 08/16/2015 FINAL  Final   Organism ID, Bacteria ESCHERICHIA COLI  Final      Susceptibility   Escherichia coli - MIC*    AMPICILLIN <=2 SENSITIVE Sensitive     CEFTAZIDIME <=1 SENSITIVE Sensitive  CEFAZOLIN <=4 SENSITIVE Sensitive     CEFTRIAXONE <=1 SENSITIVE Sensitive     CIPROFLOXACIN <=0.25 SENSITIVE Sensitive     GENTAMICIN <=1 SENSITIVE Sensitive     IMIPENEM <=0.25 SENSITIVE Sensitive     TRIMETH/SULFA <=20 SENSITIVE Sensitive     NITROFURANTOIN Value in next row Sensitive      SENSITIVE<=16    PIP/TAZO Value in next row Sensitive      SENSITIVE<=4    * >=100,000 COLONIES/mL ESCHERICHIA COLI  Blood culture (routine x 2)     Status: None (Preliminary result)   Collection Time: 08/14/15  5:54 AM  Result Value Ref Range Status   Specimen Description BLOOD RIGHT WRIST  Final   Special Requests   Final    BOTTLES DRAWN AEROBIC AND ANAEROBIC  AER 4CC ANA 8CC   Culture NO GROWTH 3 DAYS  Final   Report Status PENDING  Incomplete  Blood culture (routine x 2)     Status: None (Preliminary result)   Collection Time: 08/14/15  5:54 AM  Result Value Ref Range Status   Specimen Description BLOOD RIGHT ASSIST CONTROL  Final   Special Requests BOTTLES DRAWN AEROBIC AND ANAEROBIC  8CC  Final   Culture NO GROWTH 3 DAYS  Final   Report Status PENDING   Incomplete  Culture, expectorated sputum-assessment     Status: None   Collection Time: 08/16/15  9:55 PM  Result Value Ref Range Status   Specimen Description SPUTUM  Final   Special Requests Normal  Final   Sputum evaluation THIS SPECIMEN IS ACCEPTABLE FOR SPUTUM CULTURE  Final   Report Status 08/17/2015 FINAL  Final    RADIOLOGY:  Ct Chest Wo Contrast  08/15/2015   CLINICAL DATA:  81 year old female with abnormal chest x-ray. History of breast cancer status post right mastectomy.  EXAM: CT CHEST WITHOUT CONTRAST  TECHNIQUE: Multidetector CT imaging of the chest was performed following the standard protocol without IV contrast.  COMPARISON:  Chest x-ray 08/14/2015.  No prior chest CT.  FINDINGS: Mediastinum/Lymph Nodes: Heart size is normal. There is no significant pericardial fluid, thickening or pericardial calcification. There is atherosclerosis of the thoracic aorta, the great vessels of the mediastinum and the coronary arteries, including calcified atherosclerotic plaque in the left main, left anterior descending, left circumflex and right coronary arteries. Severe calcifications of the aortic valve. No pathologically enlarged mediastinal, internal mammary or hilar lymph nodes. Please note that accurate exclusion of hilar adenopathy is limited on noncontrast CT scans. Esophagus is unremarkable in appearance. No axillary lymphadenopathy.  Lungs/Pleura: The opacity in the right apex on the recent chest x-ray corresponds to prominent mediastinal or vascular structures. No suspicious appearing pulmonary nodules or masses are noted on today's examination. No acute consolidative airspace disease. No pleural effusions. Scattered areas of linear scarring are noted throughout the lungs bilaterally, most evident in the basal segments of the lower lobes of the lungs bilaterally.  Upper Abdomen: See report for CT the abdomen and pelvis with IV contrast 08/14/2015 for full description of upper abdominal  findings.  Musculoskeletal/Soft Tissues: There are no aggressive appearing lytic or blastic lesions noted in the visualized portions of the skeleton. Status post right modified radical mastectomy. Collapsed subpectoral breast implant incidentally noted.  IMPRESSION: 1. No suspicious appearing pulmonary nodules or masses. The findings on the recent chest radiograph corresponds to prominent mediastinal vascular structures. 2. Status post right modified radical mastectomy and subpectoral breast implant. The subpectoral breast implant appears collapsed. 3. Atherosclerosis, including  left main and 3 vessel coronary artery disease. 4. There are calcifications of the aortic valve. Echocardiographic correlation for evaluation of potential valvular dysfunction may be warranted if clinically indicated. 5. Mild cardiomegaly. 6. Additional incidental findings, as above.   Electronically Signed   By: Vinnie Langton M.D.   On: 08/15/2015 15:33    EKG:   Orders placed or performed during the hospital encounter of 08/14/15  . EKG 12-Lead  . EKG 12-Lead      Management plans discussed with the patient, family and they are in agreement.  CODE STATUS:     Code Status Orders        Start     Ordered   08/14/15 [phone]  Do not attempt resuscitation (DNR)   Continuous    Question Answer Comment  In the event of cardiac or respiratory ARREST Do not call a "code blue"   In the event of cardiac or respiratory ARREST Do not perform Intubation, CPR, defibrillation or ACLS   In the event of cardiac or respiratory ARREST Use medication by any route, position, wound care, and other measures to relive pain and suffering. May use oxygen, suction and manual treatment of airway obstruction as needed for comfort.      08/14/15 0841    Advance Directive Documentation        Most Recent Value   Type of Advance Directive  Healthcare Power of Attorney, Living will, Out of facility DNR (pink MOST or yellow form) [POA pt's  son - david Skalski]   Pre-existing out of facility DNR order (yellow form or pink MOST form)  Physician notified to receive inpatient order   "MOST" Form in Place?        TOTAL TIME TAKING CARE OF THIS PATIENT: 45 minutes.    Theodoro Grist M.D on 08/17/2015 at 2:38 PM  Between 7am to 6pm - Pager - [phone]  After 6pm go to [email or web address] - password [email or web address] [email or web address]  [facility]  Office  [phone]  CC: Primary care physician; No primary care provider on file.

## 2015-08-17 NOTE — Progress Notes (Signed)
All D/C forms completed and given to pt's son. Verbalized understanding. IV d/c intact, tele d/c. Personal belongings returned to pt. SW called walker for pt.

## 2015-08-17 NOTE — Care Management (Signed)
Patient for discharge back to [facility]. Informed that in order for patient to have her meals delivered to her apartment- there must be an order  by attending.  Spoke with [facility] and an order is not required.  Patient is agreeable to home health and no agency preference.  Referral for nursing and physical therapy to [facility].  Order is not needed for aide or slp.  Referral called and faxed. [facility] provided bsc.

## 2015-08-19 LAB — CULTURE, BLOOD (ROUTINE X 2)
Culture: NO GROWTH
Culture: NO GROWTH

## 2015-08-19 LAB — CULTURE, RESPIRATORY: SPECIAL REQUESTS: NORMAL

## 2015-08-19 LAB — CULTURE, RESPIRATORY W GRAM STAIN: Culture: NORMAL

## 2016-10-29 IMAGING — CT CT ABD-PELV W/ CM
2 of 4 series · 13 of 36 positions shown, 19 images · IV contrast (omnipaque)
Comparison: None.

CLINICAL DATA: Hypoxia.  Nausea and vomiting.

EXAM:
CT ABDOMEN AND PELVIS WITH CONTRAST
TECHNIQUE: Multidetector CT imaging of the abdomen and pelvis was performed
using the standard protocol following bolus administration of
intravenous contrast.
CONTRAST:  80mL OMNIPAQUE IOHEXOL 300 MG/ML  SOLN

[Series 2: routine abd pel with · axial · 0.86mm/px · z∈[+828,+1248]mm · 12 of 96 slices shown, 17 images]
[im 6/96  soft-tissue]
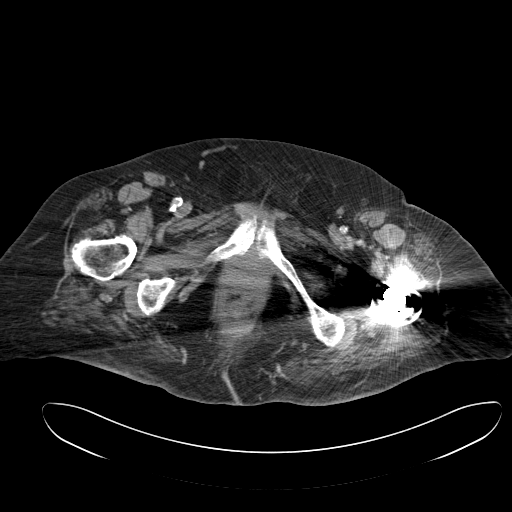
[im 6/96  bone]
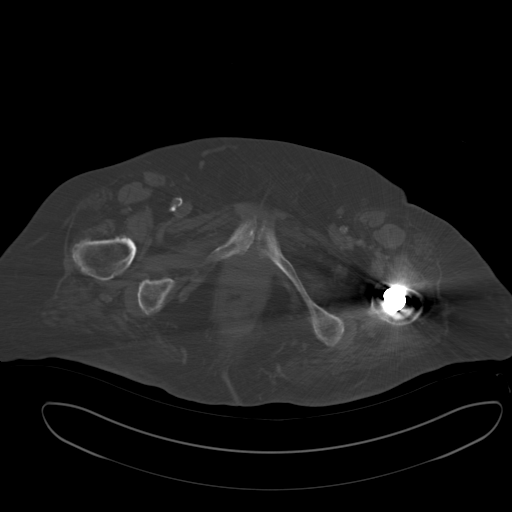
[im 17/96  soft-tissue]
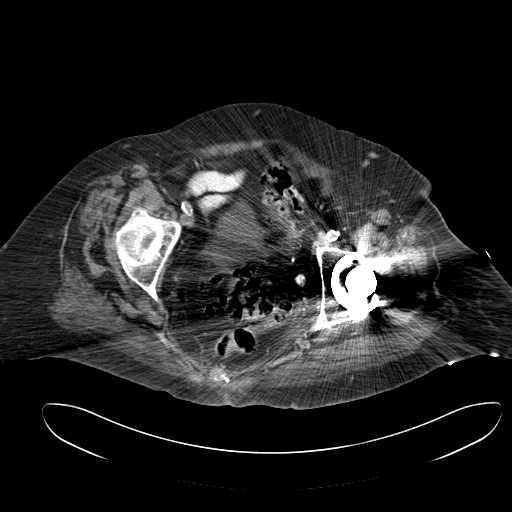
[im 23/96  soft-tissue]
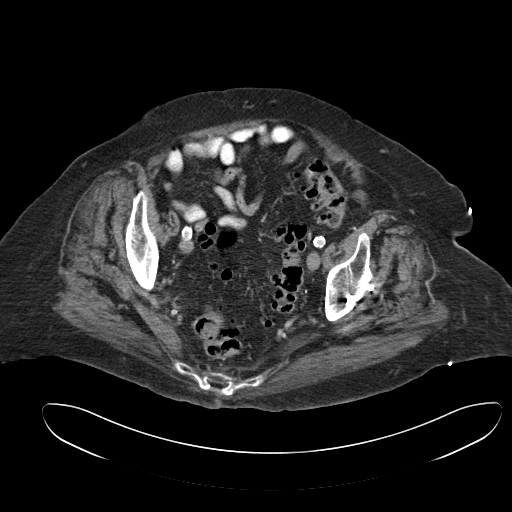
[im 34/96  soft-tissue]
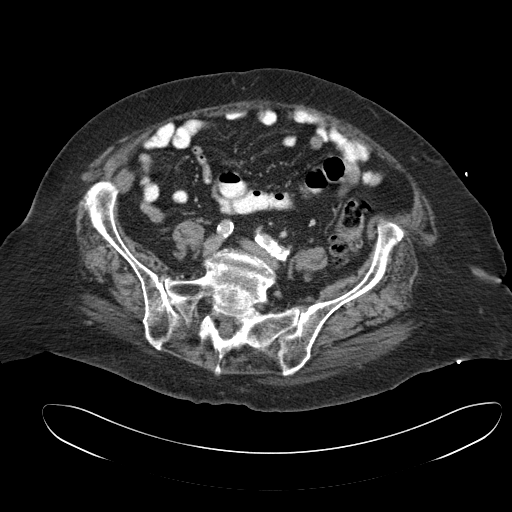
[im 40/96  soft-tissue]
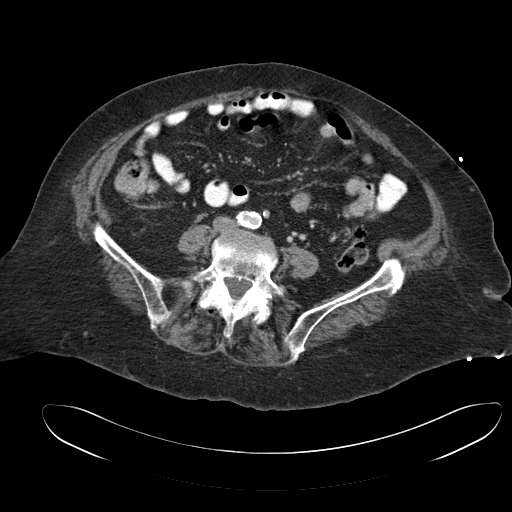
[im 51/96  soft-tissue]
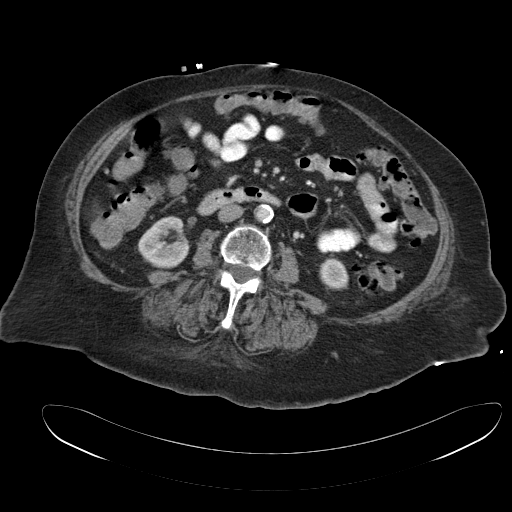
[im 56/96  soft-tissue]
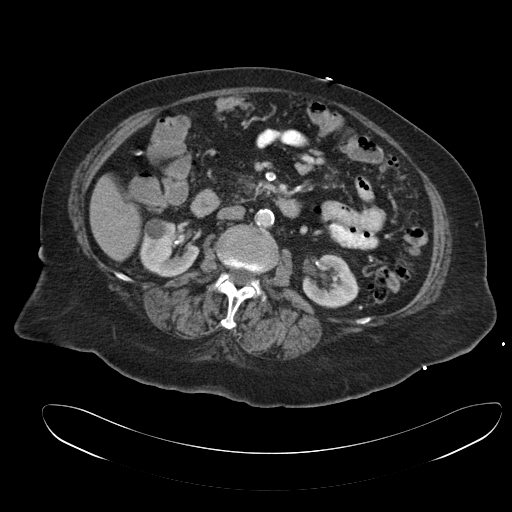
[im 62/96  soft-tissue]
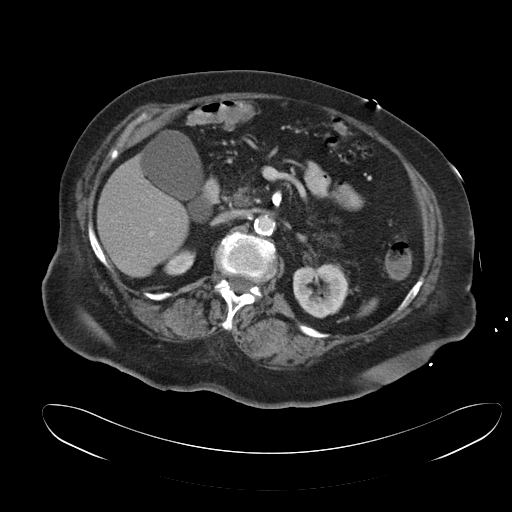
[im 73/96  soft-tissue]
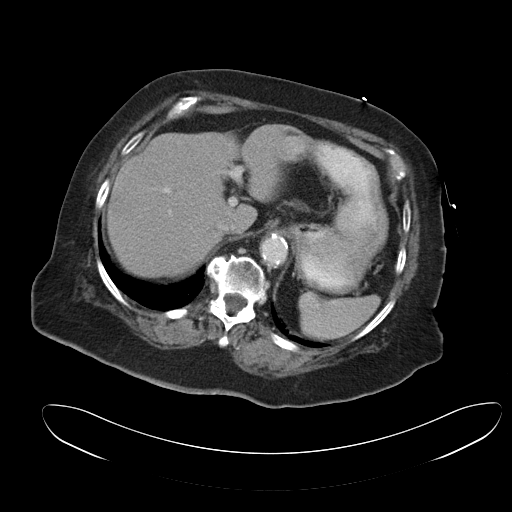
[im 73/96  lung]
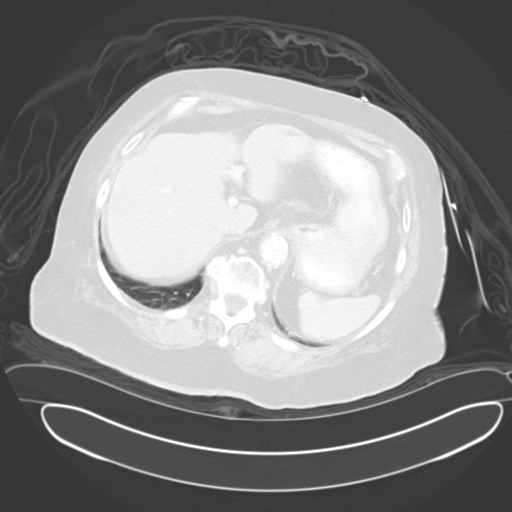
[im 73/96  bone]
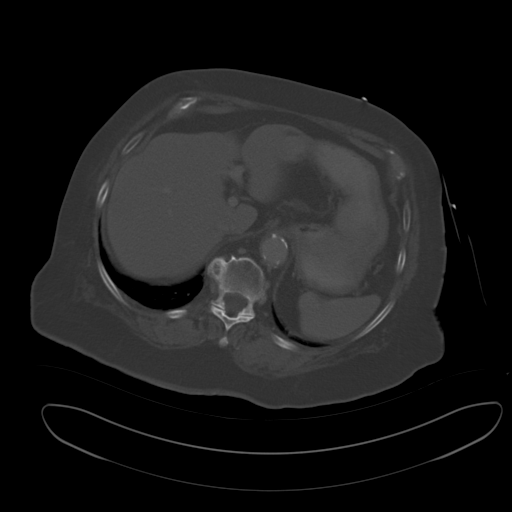
[im 79/96  soft-tissue]
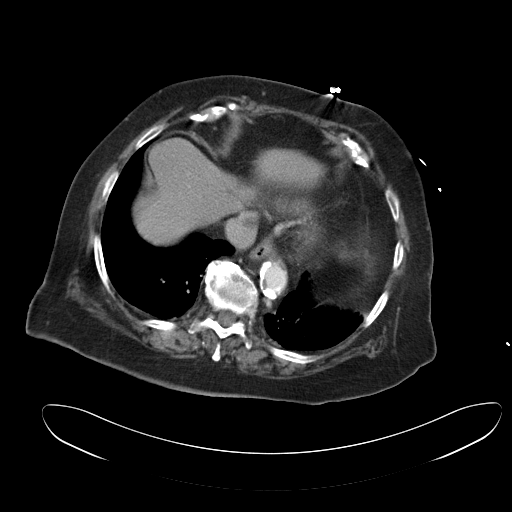
[im 79/96  lung]
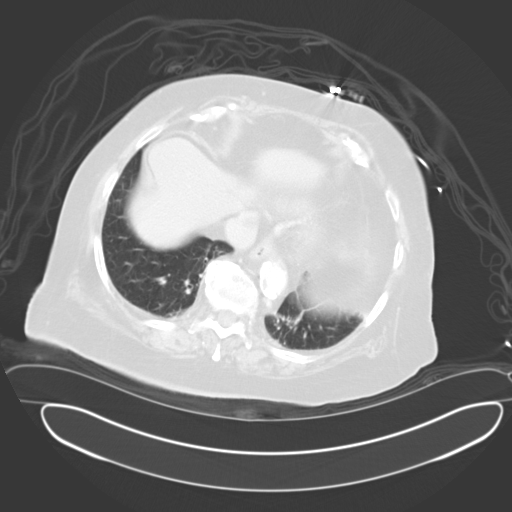
[im 84/96  lung]
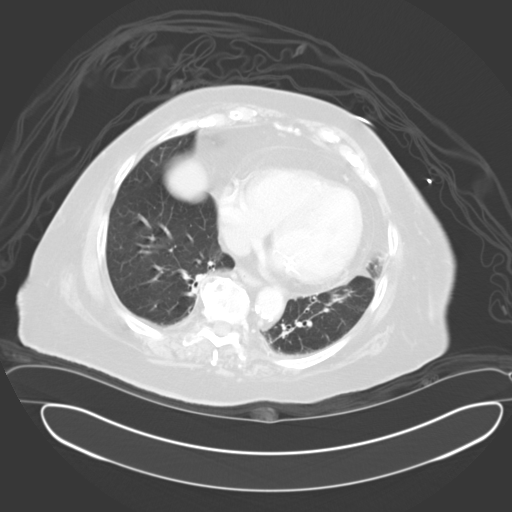
[im 90/96  soft-tissue]
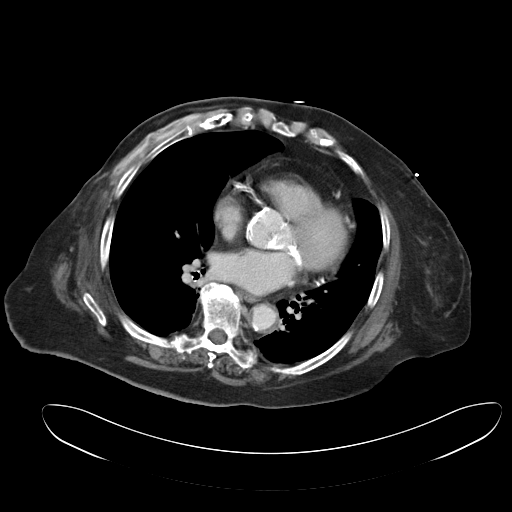
[im 90/96  lung]
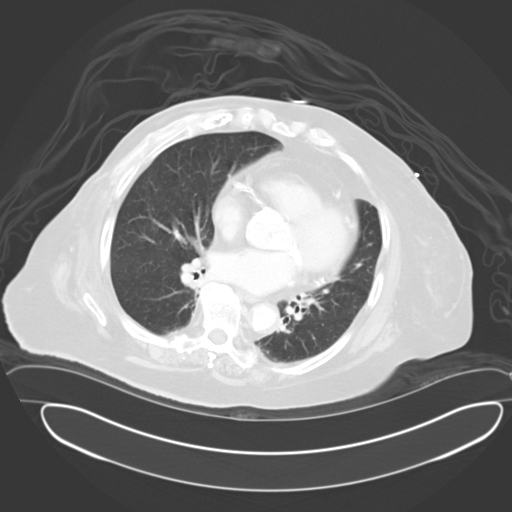

[Series 7: sag routine abd pel with · sagittal · 1.00mm/px · 1 of 177 slices shown, 2 images]
[im 59/177  soft-tissue]
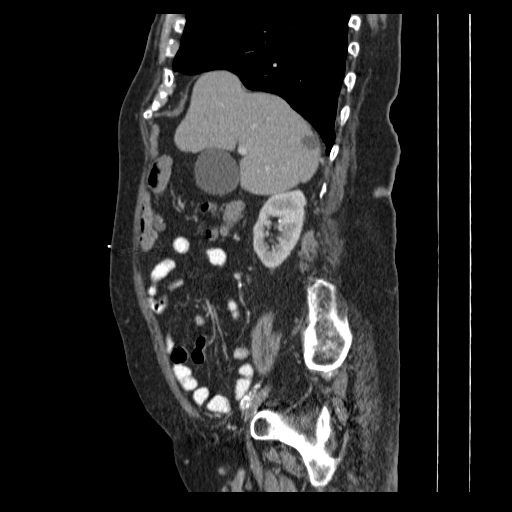
[im 59/177  bone]
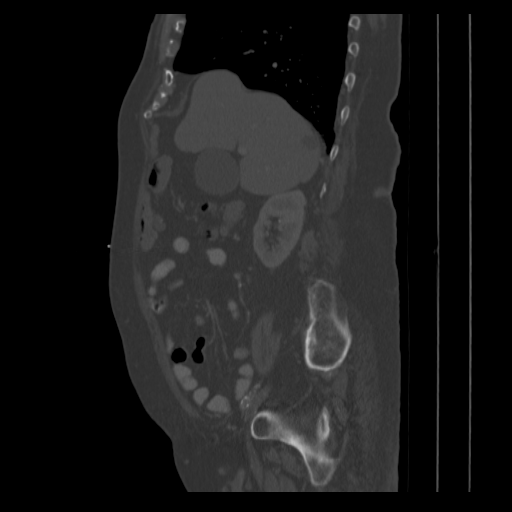

[13 of 36 positions shown; findings below may reference images not displayed]

FINDINGS: Lower chest:  Mild linear scarring in both bases.

Hepatobiliary: There is a 4.4 cm cyst in the left hepatic lobe
laterally and a 2.5 cm cyst in the right hepatic lobe posteriorly.
There are otherwise unremarkable appearances of the liver and bile
ducts.

Pancreas: Mild fatty replacement. No pancreatic mass or duct
dilatation. No acute inflammatory changes.

Spleen: Normal except for a splenic granuloma.

Adrenals/Urinary Tract: There is a 1.5 cm cyst at the anterior
aspect of the right kidney. No suspicious renal masses are evident.
There is no hydronephrosis or ureteral dilatation. No calculi are
evident, but sensitivity is reduced due to the presence of
intravenous contrast in the collecting systems. Adrenals are normal.
Urinary bladder is unremarkable.

Stomach/Bowel: There is a hiatal hernia. The stomach and small bowel
are otherwise unremarkable. There is extensive colonic
diverticulosis.

Vascular/Lymphatic: The aorta is heavily calcified but normal in
caliber.

Reproductive: Unremarkable. Probable hysterectomy. No adnexal
abnormalities are evident. Lower pelvic imaging is degraded by metal
artifact from the left hip arthroplasty.

Other: No acute inflammatory changes are evident in the abdomen or
pelvis. There is no bowel obstruction. There is no extraluminal air.
There is no ascites.

Musculoskeletal: No significant musculoskeletal lesions.
IMPRESSION: 1. No acute findings are evident in the abdomen or pelvis.
2. Hepatic cysts.  Right renal cyst.
3. Hiatal hernia.
4. Colonic diverticulosis.

## 2017-09-28 DIAGNOSIS — E1169 Type 2 diabetes mellitus with other specified complication: Secondary | ICD-10-CM

## 2017-09-28 DIAGNOSIS — E782 Mixed hyperlipidemia: Secondary | ICD-10-CM

## 2017-09-28 DIAGNOSIS — E039 Hypothyroidism, unspecified: Secondary | ICD-10-CM

## 2017-09-28 DIAGNOSIS — M170 Bilateral primary osteoarthritis of knee: Secondary | ICD-10-CM

## 2017-09-28 DIAGNOSIS — I5023 Acute on chronic systolic (congestive) heart failure: Secondary | ICD-10-CM

## 2017-09-28 DIAGNOSIS — I10 Essential (primary) hypertension: Secondary | ICD-10-CM

## 2017-09-28 HISTORY — DX: Bilateral primary osteoarthritis of knee: M17.0

## 2017-09-28 HISTORY — DX: Essential (primary) hypertension: I10

## 2017-09-28 HISTORY — DX: Mixed hyperlipidemia: E78.2

## 2017-09-28 HISTORY — DX: Hypothyroidism, unspecified: E03.9

## 2017-09-28 HISTORY — DX: Acute on chronic systolic (congestive) heart failure: I50.23

## 2017-09-28 HISTORY — DX: Type 2 diabetes mellitus with other specified complication: E11.69

## 2017-10-03 ENCOUNTER — Encounter: Payer: Self-pay | Admitting: Emergency Medicine

## 2017-10-03 ENCOUNTER — Emergency Department: Payer: Medicare Other

## 2017-10-03 ENCOUNTER — Inpatient Hospital Stay
Admission: EM | Admit: 2017-10-03 | Discharge: 2017-10-06 | DRG: 194 | Disposition: A | Discharge status: Skilled Nursing Facility | Payer: Medicare Other | Attending: Internal Medicine | Admitting: Internal Medicine

## 2017-10-03 DIAGNOSIS — Y95 Nosocomial condition: Secondary | ICD-10-CM | POA: Diagnosis present

## 2017-10-03 DIAGNOSIS — N390 Urinary tract infection, site not specified: Secondary | ICD-10-CM | POA: Diagnosis present

## 2017-10-03 DIAGNOSIS — Z888 Allergy status to other drugs, medicaments and biological substances status: Secondary | ICD-10-CM | POA: Diagnosis not present

## 2017-10-03 DIAGNOSIS — R778 Other specified abnormalities of plasma proteins: Secondary | ICD-10-CM

## 2017-10-03 DIAGNOSIS — Z9010 Acquired absence of unspecified breast and nipple: Secondary | ICD-10-CM

## 2017-10-03 DIAGNOSIS — Z853 Personal history of malignant neoplasm of breast: Secondary | ICD-10-CM

## 2017-10-03 DIAGNOSIS — E119 Type 2 diabetes mellitus without complications: Secondary | ICD-10-CM | POA: Diagnosis present

## 2017-10-03 DIAGNOSIS — L8990 Pressure ulcer of unspecified site, unspecified stage: Secondary | ICD-10-CM | POA: Diagnosis present

## 2017-10-03 DIAGNOSIS — B9689 Other specified bacterial agents as the cause of diseases classified elsewhere: Secondary | ICD-10-CM | POA: Diagnosis present

## 2017-10-03 DIAGNOSIS — Z96653 Presence of artificial knee joint, bilateral: Secondary | ICD-10-CM | POA: Diagnosis present

## 2017-10-03 DIAGNOSIS — R011 Cardiac murmur, unspecified: Secondary | ICD-10-CM | POA: Diagnosis present

## 2017-10-03 DIAGNOSIS — R911 Solitary pulmonary nodule: Secondary | ICD-10-CM | POA: Diagnosis present

## 2017-10-03 DIAGNOSIS — I10 Essential (primary) hypertension: Secondary | ICD-10-CM | POA: Diagnosis present

## 2017-10-03 DIAGNOSIS — E785 Hyperlipidemia, unspecified: Secondary | ICD-10-CM | POA: Diagnosis present

## 2017-10-03 DIAGNOSIS — H547 Unspecified visual loss: Secondary | ICD-10-CM | POA: Diagnosis present

## 2017-10-03 DIAGNOSIS — I959 Hypotension, unspecified: Secondary | ICD-10-CM | POA: Diagnosis present

## 2017-10-03 DIAGNOSIS — Z880 Allergy status to penicillin: Secondary | ICD-10-CM

## 2017-10-03 DIAGNOSIS — J189 Pneumonia, unspecified organism: Principal | ICD-10-CM

## 2017-10-03 DIAGNOSIS — Z66 Do not resuscitate: Secondary | ICD-10-CM | POA: Diagnosis present

## 2017-10-03 DIAGNOSIS — J40 Bronchitis, not specified as acute or chronic: Secondary | ICD-10-CM

## 2017-10-03 DIAGNOSIS — Z7984 Long term (current) use of oral hypoglycemic drugs: Secondary | ICD-10-CM

## 2017-10-03 DIAGNOSIS — B965 Pseudomonas (aeruginosa) (mallei) (pseudomallei) as the cause of diseases classified elsewhere: Secondary | ICD-10-CM | POA: Diagnosis present

## 2017-10-03 DIAGNOSIS — R0602 Shortness of breath: Secondary | ICD-10-CM

## 2017-10-03 DIAGNOSIS — Z96642 Presence of left artificial hip joint: Secondary | ICD-10-CM | POA: Diagnosis present

## 2017-10-03 DIAGNOSIS — Z7982 Long term (current) use of aspirin: Secondary | ICD-10-CM

## 2017-10-03 DIAGNOSIS — R7989 Other specified abnormal findings of blood chemistry: Secondary | ICD-10-CM

## 2017-10-03 DIAGNOSIS — E039 Hypothyroidism, unspecified: Secondary | ICD-10-CM | POA: Diagnosis present

## 2017-10-03 LAB — CBC WITH DIFFERENTIAL/PLATELET
Basophils Absolute: 0.1 10*3/uL (ref 0–0.1)
Basophils Relative: 1 %
EOS ABS: 0.5 10*3/uL (ref 0–0.7)
Eosinophils Relative: 3 %
HCT: 36.1 % (ref 35.0–47.0)
Hemoglobin: 11.7 g/dL — ABNORMAL LOW (ref 12.0–16.0)
LYMPHS ABS: 5.5 10*3/uL — AB (ref 1.0–3.6)
LYMPHS PCT: 37 %
MCH: 30.3 pg (ref 26.0–34.0)
MCHC: 32.5 g/dL (ref 32.0–36.0)
MCV: 93.2 fL (ref 80.0–100.0)
MONO ABS: 1.3 10*3/uL — AB (ref 0.2–0.9)
MONOS PCT: 9 %
Neutro Abs: 7.6 10*3/uL — ABNORMAL HIGH (ref 1.4–6.5)
Neutrophils Relative %: 50 %
PLATELETS: 205 10*3/uL (ref 150–440)
RBC: 3.87 MIL/uL (ref 3.80–5.20)
RDW: 14.3 % (ref 11.5–14.5)
WBC: 15 10*3/uL — ABNORMAL HIGH (ref 3.6–11.0)

## 2017-10-03 LAB — URINALYSIS, COMPLETE (UACMP) WITH MICROSCOPIC
BACTERIA UA: NONE SEEN
Bilirubin Urine: NEGATIVE
Glucose, UA: NEGATIVE mg/dL
Ketones, ur: NEGATIVE mg/dL
Nitrite: POSITIVE — AB
Protein, ur: 30 mg/dL — AB
SPECIFIC GRAVITY, URINE: 1.015 (ref 1.005–1.030)
pH: 7 (ref 5.0–8.0)

## 2017-10-03 LAB — BASIC METABOLIC PANEL
Anion gap: 11 (ref 5–15)
BUN: 31 mg/dL — AB (ref 6–20)
CALCIUM: 8.7 mg/dL — AB (ref 8.9–10.3)
CO2: 28 mmol/L (ref 22–32)
CREATININE: 1.53 mg/dL — AB (ref 0.44–1.00)
Chloride: 98 mmol/L — ABNORMAL LOW (ref 101–111)
GFR calc Af Amer: 33 mL/min — ABNORMAL LOW (ref >60)
GFR, EST NON AFRICAN AMERICAN: 28 mL/min — AB (ref >60)
GLUCOSE: 103 mg/dL — AB (ref 65–99)
POTASSIUM: 4.6 mmol/L (ref 3.5–5.1)
Sodium: 137 mmol/L (ref 135–145)

## 2017-10-03 LAB — TROPONIN I
TROPONIN I: 0.09 ng/mL — AB (ref <0.03)
Troponin I: 0.08 ng/mL (ref <0.03)

## 2017-10-03 LAB — LACTIC ACID, PLASMA: LACTIC ACID, VENOUS: 1 mmol/L (ref 0.5–1.9)

## 2017-10-03 LAB — MRSA PCR SCREENING: MRSA BY PCR: NEGATIVE

## 2017-10-03 LAB — INFLUENZA PANEL BY PCR (TYPE A & B)
Influenza A By PCR: NEGATIVE
Influenza B By PCR: NEGATIVE

## 2017-10-03 MED ORDER — VITAMIN B-12 1000 MCG PO TABS
1000.0000 ug | ORAL_TABLET | Freq: Every day | ORAL | Status: DC
  Administered 2017-10-03 – 2017-10-06 (×4): 1000 ug via ORAL
  Filled 2017-10-03 (×4): qty 1

## 2017-10-03 MED ORDER — NICOTINE POLACRILEX 2 MG MT GUM
2.0000 mg | CHEWING_GUM | OROMUCOSAL | Status: DC | PRN
  Filled 2017-10-03: qty 1

## 2017-10-03 MED ORDER — DEXTROSE 5 % IV SOLN
2.0000 g | Freq: Once | INTRAVENOUS | Status: AC
  Administered 2017-10-03: 2 g via INTRAVENOUS
  Filled 2017-10-03: qty 2

## 2017-10-03 MED ORDER — ACETAMINOPHEN 325 MG PO TABS
650.0000 mg | ORAL_TABLET | Freq: Four times a day (QID) | ORAL | Status: DC | PRN
  Administered 2017-10-03: 650 mg via ORAL
  Filled 2017-10-03: qty 2

## 2017-10-03 MED ORDER — ASPIRIN EC 81 MG PO TBEC
81.0000 mg | DELAYED_RELEASE_TABLET | Freq: Every day | ORAL | Status: DC
  Administered 2017-10-03 – 2017-10-06 (×4): 81 mg via ORAL
  Filled 2017-10-03 (×4): qty 1

## 2017-10-03 MED ORDER — IPRATROPIUM-ALBUTEROL 0.5-2.5 (3) MG/3ML IN SOLN
3.0000 mL | Freq: Four times a day (QID) | RESPIRATORY_TRACT | Status: DC
  Administered 2017-10-03 – 2017-10-05 (×8): 3 mL via RESPIRATORY_TRACT
  Filled 2017-10-03 (×10): qty 3

## 2017-10-03 MED ORDER — SODIUM CHLORIDE 0.9 % IV SOLN: 250.0000 mL | INTRAVENOUS | Status: DC | PRN

## 2017-10-03 MED ORDER — ACETAMINOPHEN 650 MG RE SUPP: 650.0000 mg | Freq: Four times a day (QID) | RECTAL | Status: DC | PRN

## 2017-10-03 MED ORDER — SODIUM CHLORIDE 0.9% FLUSH
3.0000 mL | Freq: Two times a day (BID) | INTRAVENOUS | Status: DC
  Administered 2017-10-03 – 2017-10-06 (×7): 3 mL via INTRAVENOUS

## 2017-10-03 MED ORDER — VITAMIN D3 25 MCG (1000 UNIT) PO TABS
1000.0000 [IU] | ORAL_TABLET | Freq: Every day | ORAL | Status: DC
  Administered 2017-10-03 – 2017-10-06 (×4): 1000 [IU] via ORAL
  Filled 2017-10-03 (×7): qty 1

## 2017-10-03 MED ORDER — VANCOMYCIN HCL IN DEXTROSE 1-5 GM/200ML-% IV SOLN
1000.0000 mg | Freq: Once | INTRAVENOUS | Status: AC
  Administered 2017-10-03: 1000 mg via INTRAVENOUS
  Filled 2017-10-03: qty 200

## 2017-10-03 MED ORDER — BENZONATATE 100 MG PO CAPS
200.0000 mg | ORAL_CAPSULE | Freq: Three times a day (TID) | ORAL | Status: DC
  Administered 2017-10-03 – 2017-10-06 (×10): 200 mg via ORAL
  Filled 2017-10-03 (×10): qty 2

## 2017-10-03 MED ORDER — LEVOFLOXACIN IN D5W 500 MG/100ML IV SOLN: 500.0000 mg | INTRAVENOUS | Status: DC

## 2017-10-03 MED ORDER — GUAIFENESIN-DM 100-10 MG/5ML PO SYRP
5.0000 mL | ORAL_SOLUTION | ORAL | Status: DC | PRN
  Administered 2017-10-03 – 2017-10-04 (×3): 5 mL via ORAL
  Filled 2017-10-03 (×4): qty 5

## 2017-10-03 MED ORDER — GUAIFENESIN ER 600 MG PO TB12
600.0000 mg | ORAL_TABLET | Freq: Two times a day (BID) | ORAL | Status: DC
  Administered 2017-10-03 – 2017-10-06 (×7): 600 mg via ORAL
  Filled 2017-10-03 (×7): qty 1

## 2017-10-03 MED ORDER — LEVOFLOXACIN IN D5W 500 MG/100ML IV SOLN: 500.0000 mg | Freq: Once | INTRAVENOUS | Status: DC

## 2017-10-03 MED ORDER — LEVOFLOXACIN IN D5W 250 MG/50ML IV SOLN: 250.0000 mg | INTRAVENOUS | Status: DC

## 2017-10-03 MED ORDER — ONDANSETRON HCL 4 MG/2ML IJ SOLN
4.0000 mg | Freq: Four times a day (QID) | INTRAMUSCULAR | Status: DC | PRN
  Administered 2017-10-03: 4 mg via INTRAVENOUS
  Filled 2017-10-03: qty 2

## 2017-10-03 MED ORDER — ENOXAPARIN SODIUM 40 MG/0.4ML ~~LOC~~ SOLN: 40.0000 mg | SUBCUTANEOUS | Status: DC

## 2017-10-03 MED ORDER — ONDANSETRON HCL 4 MG PO TABS: 4.0000 mg | ORAL_TABLET | Freq: Four times a day (QID) | ORAL | Status: DC | PRN

## 2017-10-03 MED ORDER — ENOXAPARIN SODIUM 30 MG/0.3ML ~~LOC~~ SOLN
30.0000 mg | SUBCUTANEOUS | Status: DC
  Administered 2017-10-03 – 2017-10-05 (×3): 30 mg via SUBCUTANEOUS
  Filled 2017-10-03 (×3): qty 0.3

## 2017-10-03 MED ORDER — LEVOTHYROXINE SODIUM 25 MCG PO TABS
25.0000 ug | ORAL_TABLET | Freq: Every day | ORAL | Status: DC
  Administered 2017-10-04 – 2017-10-06 (×3): 25 ug via ORAL
  Filled 2017-10-03 (×3): qty 1

## 2017-10-03 MED ORDER — SODIUM CHLORIDE 0.9% FLUSH: 3.0000 mL | INTRAVENOUS | Status: DC | PRN

## 2017-10-03 MED ORDER — LEVOFLOXACIN IN D5W 750 MG/150ML IV SOLN
750.0000 mg | Freq: Once | INTRAVENOUS | Status: AC
  Administered 2017-10-04: 09:00:00 750 mg via INTRAVENOUS
  Filled 2017-10-03: qty 150

## 2017-10-03 NOTE — Plan of Care (Signed)
Problem: SLP Dysphagia Goals Goal: Misc Dysphagia Goal Pt will safely tolerate po diet of least restrictive consistency w/ no overt s/s of aspiration noted by Staff/pt/family x3 sessions.    

## 2017-10-03 NOTE — H&P (Addendum)
[facility] NAME: Kathleen Hampton    MR#:  [id number]  DATE OF BIRTH:  [birth year]  DATE OF ADMISSION:  10/03/2017  PRIMARY CARE PHYSICIAN: System, Pcp Not In   REQUESTING/REFERRING PHYSICIAN: Charlesetta Ivory MD  CHIEF COMPLAINT:   Chief Complaint  Patient presents with  . Cough  . Weakness    HISTORY OF PRESENT ILLNESS: Kathleen Hampton  is a 83 y.o. female with a known history of blindness, breast cancer, diabetes type 2, essential hypertension, hyperlipidemia, history of subclavian steal syndrome who was sent from facility due to patient not feeling well. Patient was having difficulty with breathing and felt warm to touch.According to her son patient has had cough and congestion ongoing for the past few months to a year. Patient had a chest x-ray in the emergency room which showed a right upper lobe pneumonia.she is a very poor historian  PAST MEDICAL HISTORY:   Past Medical History:  Diagnosis Date  . Blind   . Cancer [facility])    breast, s/p mastectomy, no recurrence  . Colon polyps   . Diabetes mellitus without complication ([place])   . Hyperlipidemia   . Hypertension   . Subclavian steal syndrome     PAST SURGICAL HISTORY: Past Surgical History:  Procedure Laterality Date  . ABDOMINAL HYSTERECTOMY    . MASTECTOMY Right   . REPLACEMENT TOTAL KNEE BILATERAL    . TOTAL HIP ARTHROPLASTY Left     SOCIAL HISTORY:  Social History  Substance Use Topics  . Smoking status: Never Smoker  . Smokeless tobacco: Never Used  . Alcohol use No    FAMILY HISTORY:  Family History  Problem Relation Age of Onset  . Colon cancer Brother     DRUG ALLERGIES:  Allergies  Allergen Reactions  . Penicillins Anaphylaxis  . Lodine [Etodolac]     REVIEW OF SYSTEMS:   CONSTITUTIONAL: No fever, fatigue or positive weakness.  EYES: No blurred or double vision.  EARS, NOSE, AND THROAT: No tinnitus or ear pain.  RESPIRATORY: No  cough,positive shortness of breath, positivewheezing or hemoptysis.  CARDIOVASCULAR: No chest pain, orthopnea, edema.  GASTROINTESTINAL: No nausea, vomiting, diarrhea or abdominal pain.  GENITOURINARY: No dysuria, hematuria.  ENDOCRINE: No polyuria, nocturia,  HEMATOLOGY: No anemia, easy bruising or bleeding SKIN: No rash or lesion. MUSCULOSKELETAL: No joint pain or arthritis.   NEUROLOGIC: No tingling, numbness, weakness.  PSYCHIATRY: No anxiety or depression.   MEDICATIONS AT HOME:  Prior to Admission medications   Medication Sig Start Date End Date Taking? Authorizing Provider  aspirin 81 MG tablet Take 81 mg by mouth daily.   Yes [provider]  Cholecalciferol (D3 HIGH POTENCY) 1000 units capsule Take 1,000 Units by mouth daily.   Yes [provider]  furosemide (LASIX) 20 MG tablet Take 20 mg by mouth daily.   Yes [provider]  levothyroxine (SYNTHROID, LEVOTHROID) 25 MCG tablet Take 25 mcg by mouth daily before breakfast.   Yes [provider]  vitamin B-12 (CYANOCOBALAMIN) 1000 MCG tablet Take 1,000 mcg by mouth daily.   Yes [provider]  levofloxacin (LEVAQUIN) 250 MG tablet Take 1 tablet (250 mg total) by mouth daily. Patient not taking: Reported on 10/03/2017 08/17/15   Theodoro Grist, MD      PHYSICAL EXAMINATION:   VITAL SIGNS: Blood pressure (!) 131/54, pulse 98, temperature (!) 97.5 F (36.4 C), temperature source Oral, resp. rate 18, height 5\' 5"  (1.651 m), weight  156 lb 8.4 oz (71 kg), SpO2 95 %.  GENERAL:  83 y.o.-year-old patient lying in the bed with no acute distress.  EYES: Pupils equal, round, reactive to light and accommodation. No scleral icterus. Extraocular muscles intact.  HEENT: Head atraumatic, normocephalic. Oropharynx and nasopharynx clear.  NECK:  Supple, no jugular venous distention. No thyroid enlargement, no tenderness.  LUNGS: rhonchus breath sounds bilaterally without any necessary muscle  usage CARDIOVASCULAR: S1, S2 normal. Positive systolic murmurs, rubs, or gallops.  ABDOMEN: Soft, nontender, nondistended. Bowel sounds present. No organomegaly or mass.  EXTREMITIES: No pedal edema, cyanosis, or clubbing.  NEUROLOGIC: Cranial nerves II through XII are intact. Muscle strength 5/5 in all extremities. Sensation intact. Gait not checked.  PSYCHIATRIC: The patient is alert and oriented x 3.  SKIN: No obvious rash, lesion, or ulcer.   LABORATORY PANEL:   CBC  Recent Labs Lab 10/03/17 0544  WBC 15.0*  HGB 11.7*  HCT 36.1  PLT 205  MCV 93.2  MCH 30.3  MCHC 32.5  RDW 14.3  LYMPHSABS 5.5*  MONOABS 1.3*  EOSABS 0.5  BASOSABS 0.1   ------------------------------------------------------------------------------------------------------------------  Chemistries   Recent Labs Lab 10/03/17 0544  NA 137  K 4.6  CL 98*  CO2 28  GLUCOSE 103*  BUN 31*  CREATININE 1.53*  CALCIUM 8.7*   ------------------------------------------------------------------------------------------------------------------ estimated creatinine clearance is 22.7 mL/min (A) (by C-G formula based on SCr of 1.53 mg/dL (H)). ------------------------------------------------------------------------------------------------------------------ No results for input(s): TSH, T4TOTAL, T3FREE, THYROIDAB in the last 72 hours.  Invalid input(s): FREET3   Coagulation profile No results for input(s): INR, PROTIME in the last 168 hours. ------------------------------------------------------------------------------------------------------------------- No results for input(s): DDIMER in the last 72 hours. -------------------------------------------------------------------------------------------------------------------  Cardiac Enzymes  Recent Labs Lab 10/03/17 0544 10/03/17 0950  TROPONINI 0.08* 0.09*    ------------------------------------------------------------------------------------------------------------------ Invalid input(s): POCBNP  ---------------------------------------------------------------------------------------------------------------  Urinalysis    Component Value Date/Time   COLORURINE YELLOW (A) 10/03/2017 0817   APPEARANCEUR CLOUDY (A) 10/03/2017 0817   LABSPEC 1.015 10/03/2017 0817   PHURINE 7.0 10/03/2017 0817   GLUCOSEU NEGATIVE 10/03/2017 0817   HGBUR MODERATE (A) 10/03/2017 0817   BILIRUBINUR NEGATIVE 10/03/2017 0817   KETONESUR NEGATIVE 10/03/2017 0817   PROTEINUR 30 (A) 10/03/2017 0817   NITRITE POSITIVE (A) 10/03/2017 0817   LEUKOCYTESUR LARGE (A) 10/03/2017 0817     RADIOLOGY: Dg Chest 2 View  Result Date: 10/03/2017 CLINICAL DATA:  Progressive cough for 1 week. Hypoxia. Shortness of breath. History of breast cancer, diabetes. EXAM: CHEST  2 VIEW COMPARISON:  Chest radiograph August 14, 2015 FINDINGS: Cardiomediastinal silhouette is normal. Calcified aortic knob. Chronic interstitial changes. Faint airspace opacity RIGHT upper quadrant. Similar LEFT lung base pleural thickening and strandy densities. No pneumothorax. Osteopenia. IMPRESSION: RIGHT upper lobe pneumonia versus prominent RIGHT anterior first rib. Chronic interstitial changes. Followup PA and lateral chest X-ray is recommended in 3-4 weeks following trial of antibiotic therapy to ensure resolution and exclude underlying malignancy. LEFT lung base atelectasis/scarring. Aortic Atherosclerosis (ICD10-I70.0). Electronically Signed   By: Elon Alas M.D.   On: 10/03/2017 06:37    EKG: Orders placed or performed during the hospital encounter of 10/03/17  . EKG 12-Lead  . EKG 12-Lead    IMPRESSION AND PLAN: Patient is a 83 year old brought in from [facility] [place] for shortness of breath cough  1. Pneumonia WBC count is 15,000  I will treat her with IV Levaquin  We'll place her on  mucolytics  Antitussives  Also place on neubulizer Elevated troponin due to demand  ischemia   2. Hypothyroidism continue Synthroid and check a TSH  3. Systolic murmur patient was supposed to have a echo as outpatient I will order an echo of the heart  4. Essential hypertension patient currently not on any medications will monitor blood pressure  5. Diabetes type 2 check a hemoglobin A1c and place her on sliding scale insulin  6. Misc: lovenox for dvt proph   All the records are reviewed and case discussed with ED provider. Management plans discussed with the patient, family and they are in agreement.  CODE STATUS:    Code Status Orders        Start     Ordered   10/03/17 [phone]  Do not attempt resuscitation (DNR)  Continuous    Question Answer Comment  In the event of cardiac or respiratory ARREST Do not call a "code blue"   In the event of cardiac or respiratory ARREST Do not perform Intubation, CPR, defibrillation or ACLS   In the event of cardiac or respiratory ARREST Use medication by any route, position, wound care, and other measures to relive pain and suffering. May use oxygen, suction and manual treatment of airway obstruction as needed for comfort.      10/03/17 8453    Code Status History    Date Active Date Inactive Code Status Order ID Comments User Context   08/14/2015  8:41 AM 08/17/2015  6:14 PM DNR [id number]  Lance Coon, MD Inpatient    Advance Directive Documentation     Most Recent Value  Type of Advance Directive  Out of facility DNR (pink MOST or yellow form), Healthcare Power of Attorney  Pre-existing out of facility DNR order (yellow form or pink MOST form)  Yellow form placed in chart (order not valid for inpatient use)  "MOST" Form in Place?  -       TOTAL TIME TAKING CARE OF THIS PATIENT: 55 minutes.    Dustin Flock M.D on 10/03/2017 at 2:19 PM  Between 7am to 6pm - Pager - [phone]  After 6pm go to [email or web address] - password [email or web address]  [facility]  Office  [phone]  CC: Primary care physician; System, Pcp Not In

## 2017-10-03 NOTE — ED Triage Notes (Signed)
Pt arrived via ems from [facility]. EMS reports pt has had a progressively worsening cough over the last week. According to ems report pt became short of breath tonight which prompted the facility to send pt in for evaluation. Upon arrival pt alert on oxygen saturation 94% on room air.

## 2017-10-03 NOTE — Progress Notes (Signed)
Lovenox 40mg  q24 hours ordered.  Pharmacy adjusted dose to 30mg  q 24 hours based on CrCl 22.7 ml/min.  Charlane Ferretti, RPh 10/03/2017

## 2017-10-03 NOTE — Evaluation (Signed)
Clinical/Bedside Swallow Evaluation Patient Details  Name: Kathleen Hampton MRN: [id number] Date of Birth: 09-May-1922  Today's Date: 10/03/2017 Time: SLP Start Time (ACUTE ONLY): 57 SLP Stop Time (ACUTE ONLY): 1140 SLP Time Calculation (min) (ACUTE ONLY): 60 min  Past Medical History:  Past Medical History:  Diagnosis Date  . Blind   . Cancer [facility])    breast, s/p mastectomy, no recurrence  . Colon polyps   . Diabetes mellitus without complication ([place])   . Hyperlipidemia   . Hypertension   . Subclavian steal syndrome    Past Surgical History:  Past Surgical History:  Procedure Laterality Date  . ABDOMINAL HYSTERECTOMY    . MASTECTOMY Right   . REPLACEMENT TOTAL KNEE BILATERAL    . TOTAL HIP ARTHROPLASTY Left    HPI:  Pt  is a 83 y.o. female w/ PMH of DM, HTN, blind, and breast CA who comes into the hospital today not feeling well. According to EMS the patient is having a hard time breathing and has felt warm to touch. They state that her O2 sats were 94% on room air, she is from [facility] [place]. The patient states that she can't see well and she cannot walk - reports it is worse tonight(blind at baseline per chart note). She got up at midnight to urinate and then states that she went back to sleep. The patient though could not fully answer all of the questions to articulate why she felt that her vision was changed and she was weak. Chest CT revealed corresponds to prominent mediastinal vascular structures, status post right modified radical mastectomy and subpectoral breast implant which appears collapsed, no acute consolidative airspace disease, and Scattered areas of linear scarring are noted throughout the lungs bilaterally, most evident in the basal segments of the lower lobes of the lungs bilaterally.    Assessment / Plan / Recommendation Clinical Impression  Pt appears to present w/ adequate oropharyngeal phase swallowing abilities w/ no immediate, overt coughing or other s/s of  aspiration(clear vocal quality post swallows), however, pt exhibited intermittent, tight congested coughing which appeared to increase as she talked. Pt indicated this presentation of cough was baseline for her "for many years"(?). Pt consumed trials of thin liquids, purees, and softened solids to ease exertion of mastication. Pt fed self w/ min assistance and setup d/t Vision deficits. Oral phase appeared wfl for bolus management and clearing orally. Due to pt's presentation of cough w/ exertion around oral intake, will recommend an objective assessment of swallowing if pt/MD feel appropriate in the next 1-2 days. Recommend a Dysphagia level 3 w/ thin liquids w/ strict aspiration precautions; Pills w/ Ensure (d/t pt not wanting to take them w/ applesauce or yogurt/pudding). Assistance at meals d/t Vision deficits. Will f/u w/ pt's status, further assessment while admitted. NSG updated.  SLP Visit Diagnosis: Dysphagia, pharyngeal phase (R13.13) (?)    Aspiration Risk   (reduced following precautions)    Diet Recommendation  Dysphagia level 3 (mech soft for easier mastication and effort); Thin liquids. Aspiration precautions and feeding support d/t vision deficits  Medication Administration:  (Ensure w/ Pills)    Other  Recommendations Recommended Consults:  (Dietician f/u) Oral Care Recommendations: Oral care BID;Staff/trained caregiver to provide oral care   Follow up Recommendations Skilled Nursing facility      Frequency and Duration min 3x week  2 weeks       Prognosis Prognosis for Safe Diet Advancement: Good Barriers to Reach Goals: Time post onset ((of the  cough))      Swallow Study   General Date of Onset: 10/03/17 HPI: Pt  is a 83 y.o. female w/ PMH of DM, HTN, blind, and breast CA who comes into the hospital today not feeling well. According to EMS the patient is having a hard time breathing and has felt warm to touch. They state that her O2 sats were 94% on room air, she is  from [facility] [place]. The patient states that she can't see well and she cannot walk - reports it is worse tonight(blind at baseline per chart note). She got up at midnight to urinate and then states that she went back to sleep. The patient though could not fully answer all of the questions to articulate why she felt that her vision was changed and she was weak. Chest CT revealed corresponds to prominent mediastinal vascular structures, status post right modified radical mastectomy and subpectoral breast implant which appears collapsed, no acute consolidative airspace disease, and Scattered areas of linear scarring are noted throughout the lungs bilaterally, most evident in the basal segments of the lower lobes of the lungs bilaterally.  Type of Study: Bedside Swallow Evaluation Previous Swallow Assessment: none reported Diet Prior to this Study: Regular;Thin liquids (per pt though she does not eat much) Temperature Spikes Noted: No (wbc elevated) Respiratory Status: Room air History of Recent Intubation: No Behavior/Cognition: Alert;Cooperative;Pleasant mood;Distractible;Requires cueing (unsure of baseline Cognitive status; vision deficits) Oral Cavity Assessment: Within Functional Limits Oral Care Completed by SLP: Recent completion by staff Oral Cavity - Dentition: Dentures, top;Dentures, bottom Vision: Functional for self-feeding Self-Feeding Abilities: Able to feed self;Needs assist;Needs set up (vision deficits) Patient Positioning: Upright in bed Baseline Vocal Quality: Normal Volitional Cough: Strong;Congested Volitional Swallow: Able to elicit    Oral/Motor/Sensory Function Overall Oral Motor/Sensory Function: Within functional limits   Ice Chips Ice chips: Within functional limits Presentation: Spoon (fed; 3 trials)   Thin Liquid Thin Liquid: Within functional limits Presentation: Cup;Self Fed;Straw (~4 ozs) Other Comments: congested cough b/t trials intermittently similar to her  baseline cough - did not appear to increase w/ frequency w/ drinking but did increase w/ talking. Pt stated she had had "this cough for years".    Nectar Thick Nectar Thick Liquid: Not tested   Honey Thick Honey Thick Liquid: Not tested   Puree Puree: Within functional limits Presentation: Self Fed;Spoon (5 trials) Other Comments: interittent cough   Solid   GO   Solid: Impaired (mech soft trials) Presentation: Self Fed (3 trials) Oral Phase Impairments:  (none) Oral Phase Functional Implications:  (none) Pharyngeal Phase Impairments:  (no immediate coughing but similar baseline cough)    Functional Assessment Tool Used: clinical judgement Functional Limitations: Swallowing Swallow Current Status (D6222): At least 1 percent but less than 20 percent impaired, limited or restricted Swallow Goal Status [id number]): At least 1 percent but less than 20 percent impaired, limited or restricted Swallow Discharge Status [id number]): At least 1 percent but less than 20 percent impaired, limited or restricted    Kathleen Kenner, MS, CCC-SLP Kathleen Hampton 10/03/2017,12:10 PM

## 2017-10-03 NOTE — ED Provider Notes (Signed)
[facility] Emergency Department Provider Note   ____________________________________________   First MD Initiated Contact with Patient 10/03/17 0530     (approximate)  I have reviewed the triage vital signs and the nursing notes.   HISTORY  Chief Complaint Cough and Weakness    HPI Kathleen Hampton is a 83 y.o. female who comes into the hospital today not feeling well. According to EMS the patient is having a hard time breathing and has felt warm to touch. They state that her O2 sats were 94% on room air and she is from [facility] [place]. The patient states that she can't see well and she cannot walk. She reports it is worse tonight. She got up at midnight to urinate and then states that she went back to sleep. The patient though could not fully answer all of the questions and could not fully articulate why she felt that her vision was changed and she was weak.patient does have a wet sounding cough. She is here this evening for evaluation.   Past Medical History:  Diagnosis Date  . Blind   . Cancer [facility])    breast, s/p mastectomy, no recurrence  . Colon polyps   . Diabetes mellitus without complication ([place])   . Hyperlipidemia   . Hypertension   . Subclavian steal syndrome     Patient Active Problem List   Diagnosis Date Noted  . Acute gastroenteritis 08/17/2015  . Dehydration 08/17/2015  . Sepsis ([place]) 08/14/2015  . UTI (lower urinary tract infection) 08/14/2015  . Hyperkalemia 08/14/2015  . Type II diabetes mellitus ([place]) 08/14/2015  . HTN (hypertension) 08/14/2015  . HLD (hyperlipidemia) 08/14/2015    Past Surgical History:  Procedure Laterality Date  . ABDOMINAL HYSTERECTOMY    . MASTECTOMY Right   . REPLACEMENT TOTAL KNEE BILATERAL    . TOTAL HIP ARTHROPLASTY Left     Prior to Admission medications   Medication Sig Start Date End Date Taking? Authorizing Provider  aspirin 81 MG tablet Take 81 mg by mouth daily.    [provider]  glipiZIDE (GLUCOTROL) 5 MG tablet Take 5 mg by mouth daily before breakfast.    [provider]  levofloxacin (LEVAQUIN) 250 MG tablet Take 1 tablet (250 mg total) by mouth daily. 08/17/15   Theodoro Grist, MD  lisinopril-hydrochlorothiazide (PRINZIDE,ZESTORETIC) 10-12.5 MG per tablet Take 1 tablet by mouth daily. 06/30/15   [provider]  loratadine (CLARITIN) 10 MG tablet Take 10 mg by mouth daily.    [provider]  Omega-3 Fatty Acids (FISH OIL) 435 MG CAPS Take 1 capsule by mouth daily.    [provider]  simvastatin (ZOCOR) 10 MG tablet Take 10 mg by mouth daily.    [provider]    Allergies Penicillins and Lodine [etodolac]  Family History  Problem Relation Age of Onset  . Colon cancer Brother     Social History Social History  Substance Use Topics  . Smoking status: Never Smoker  . Smokeless tobacco: Not on file  . Alcohol use No    Review of Systems  Constitutional: No fever/chills Eyes: vision impairment ENT: No sore throat. Cardiovascular: Denies chest pain. Respiratory: cough and shortness of breath. Gastrointestinal: No abdominal pain.  No nausea, no vomiting.  No diarrhea.  No constipation. Genitourinary: Negative for dysuria. Musculoskeletal: Negative for back pain. Skin: Negative for rash. Neurological: weakness   ____________________________________________   PHYSICAL EXAM:  VITAL SIGNS: ED Triage Vitals  10/03/17 0538  Enc Vitals Group     BP 141/79     Pulse 94     Resp 24     Temp 98.3     Temp src      SpO2 94% on RA     Weight      Height      Head Circumference      Peak Flow      Pain Score      Pain Loc      Pain Edu?      Excl. in [place]?    Constitutional: Alert . Well appearing and in mild distress. Eyes: Conjunctivae are normal. EOMI. Left pupil misshapen with no reaction, appears post surgical Head: Atraumatic. Nose: No  congestion/rhinnorhea. Mouth/Throat: Mucous membranes are moist.  Oropharynx non-erythematous. Cardiovascular: Normal rate, regular rhythm. Systolic murmur.  Good peripheral circulation. Respiratory: Normal respiratory effort.  No retractions. Expiratory rhonchi in right base. Gastrointestinal: Soft and nontender. No distention. Positive bowel sounds. Musculoskeletal: No lower extremity tenderness nor edema.   Neurologic:  Normal speech and language.  Skin:  Skin is warm, dry and intact.  Psychiatric: Mood and affect are normal.   ____________________________________________   LABS (all labs ordered are listed, but only abnormal results are displayed)  Labs Reviewed  CBC WITH DIFFERENTIAL/PLATELET - Abnormal; Notable for the following:       Result Value   WBC 15.0 (*)    Hemoglobin 11.7 (*)    Neutro Abs 7.6 (*)    Lymphs Abs 5.5 (*)    Monocytes Absolute 1.3 (*)    All other components within normal limits  BASIC METABOLIC PANEL - Abnormal; Notable for the following:    Chloride 98 (*)    Glucose, Bld 103 (*)    BUN 31 (*)    Creatinine, Ser 1.53 (*)    Calcium 8.7 (*)    GFR calc non Af Amer 28 (*)    GFR calc Af Amer 33 (*)    All other components within normal limits  TROPONIN I - Abnormal; Notable for the following:    Troponin I 0.08 (*)    All other components within normal limits  CULTURE, BLOOD (ROUTINE X 2)  CULTURE, BLOOD (ROUTINE X 2)  LACTIC ACID, PLASMA  TROPONIN I  INFLUENZA PANEL BY PCR (TYPE A & B)  URINALYSIS, COMPLETE (UACMP) WITH MICROSCOPIC   ____________________________________________  EKG  ED ECG REPORT I, Loney Hering, the attending physician, personally viewed and interpreted this ECG.   Date: 10/03/2017  EKG Time: 537  Rate: 81  Rhythm: normal sinus rhythm  Axis: normal  Intervals:none  ST&T Change: none  ____________________________________________  RADIOLOGY  Dg Chest 2 View  Result Date: 10/03/2017 CLINICAL DATA:   Progressive cough for 1 week. Hypoxia. Shortness of breath. History of breast cancer, diabetes. EXAM: CHEST  2 VIEW COMPARISON:  Chest radiograph August 14, 2015 FINDINGS: Cardiomediastinal silhouette is normal. Calcified aortic knob. Chronic interstitial changes. Faint airspace opacity RIGHT upper quadrant. Similar LEFT lung base pleural thickening and strandy densities. No pneumothorax. Osteopenia. IMPRESSION: RIGHT upper lobe pneumonia versus prominent RIGHT anterior first rib. Chronic interstitial changes. Followup PA and lateral chest X-ray is recommended in 3-4 weeks following trial of antibiotic therapy to ensure resolution and exclude underlying malignancy. LEFT lung base atelectasis/scarring. Aortic Atherosclerosis (ICD10-I70.0). Electronically Signed   By: Elon Alas M.D.   On: 10/03/2017 06:37    ____________________________________________   PROCEDURES  Procedure(s) performed: None  Procedures  Critical Care performed: No  ____________________________________________   INITIAL IMPRESSION / ASSESSMENT AND PLAN / ED COURSE  As part of my medical decision making, I reviewed the following data within the electronic MEDICAL RECORD NUMBER Notes from prior ED visits and [place] Controlled Substance Database   This is a 83 year old female who comes into the hospital today with some difficulty breathing cough and feeling unwell. Although the patient states that she is unable to see she has a history of visual impairment.  My differential diagnosis includes pneumonia, bronchitis, urinary tract infection  I sent the patient for chest x-ray and it was red with right upper lobe pneumonia. The patient does have a very coarse rattly sounding cough. the patient has a white blood cell count of 15 and a troponin 0.08. The patient's lactic acid is unremarkable. I did give the patient a dose of aztreonam and vancomycin given that she's been in the hospital previously and she has a penicillin  allergy. we are still waiting on the urinalysis to result from patient. She will be admitted to the hospitalist service for further evaluation of her pneumonia and her respiratory distress.      ____________________________________________   FINAL CLINICAL IMPRESSION(S) / ED DIAGNOSES  Final diagnoses:  Healthcare-associated pneumonia  Bronchitis  Elevated troponin      NEW MEDICATIONS STARTED DURING THIS VISIT:  New Prescriptions   No medications on file     Note:  This document was prepared using Dragon voice recognition software and may include unintentional dictation errors.    Loney Hering, MD 10/03/17 [phone]

## 2017-10-03 NOTE — ED Notes (Signed)
Attempted report on patient. RN reported she will call back because she is giving medicines with a student right now

## 2017-10-03 NOTE — ED Notes (Signed)
Patients brief changed to new one and peri care completed

## 2017-10-04 ENCOUNTER — Inpatient Hospital Stay
Admit: 2017-10-04 | Discharge: 2017-10-04 | Disposition: A | Discharge status: Home / Self Care | Payer: Medicare Other | Attending: Internal Medicine | Admitting: Internal Medicine

## 2017-10-04 LAB — BASIC METABOLIC PANEL
Anion gap: 7 (ref 5–15)
BUN: 33 mg/dL — AB (ref 6–20)
CALCIUM: 8.4 mg/dL — AB (ref 8.9–10.3)
CO2: 31 mmol/L (ref 22–32)
CREATININE: 1.46 mg/dL — AB (ref 0.44–1.00)
Chloride: 99 mmol/L — ABNORMAL LOW (ref 101–111)
GFR calc non Af Amer: 30 mL/min — ABNORMAL LOW (ref >60)
GFR, EST AFRICAN AMERICAN: 35 mL/min — AB (ref >60)
Glucose, Bld: 95 mg/dL (ref 65–99)
Potassium: 4.4 mmol/L (ref 3.5–5.1)
SODIUM: 137 mmol/L (ref 135–145)

## 2017-10-04 LAB — CBC
HCT: 32.4 % — ABNORMAL LOW (ref 35.0–47.0)
Hemoglobin: 10.4 g/dL — ABNORMAL LOW (ref 12.0–16.0)
MCH: 30.1 pg (ref 26.0–34.0)
MCHC: 32.3 g/dL (ref 32.0–36.0)
MCV: 93.3 fL (ref 80.0–100.0)
Platelets: 163 10*3/uL (ref 150–440)
RBC: 3.47 MIL/uL — ABNORMAL LOW (ref 3.80–5.20)
RDW: 14.5 % (ref 11.5–14.5)
WBC: 14.1 10*3/uL — ABNORMAL HIGH (ref 3.6–11.0)

## 2017-10-04 LAB — TROPONIN I: Troponin I: 0.11 ng/mL (ref <0.03)

## 2017-10-04 LAB — ECHOCARDIOGRAM COMPLETE
HEIGHTINCHES: 65 in
Weight: 2504.43 oz

## 2017-10-04 MED ORDER — SODIUM CHLORIDE 0.9 % IV BOLUS (SEPSIS)
500.0000 mL | Freq: Once | INTRAVENOUS | Status: AC
  Administered 2017-10-04: 10:00:00 500 mL via INTRAVENOUS

## 2017-10-04 NOTE — Care Management (Addendum)
Patient recently at [facility] for sepsis. She lives in [place]. Blind. Have contacted [facility] to see if patient is still active with them. Awaiting return call.

## 2017-10-04 NOTE — Evaluation (Signed)
Physical Therapy Evaluation Patient Details Name: Kathleen Hampton MRN: [id number] DOB: [birth year] Today's Date: 10/04/2017   History of Present Illness  Pt is a 83 y.o. F with PMH including R breast cancer, blindness, type 2 diabetes mellitus, HTN, HLD, hyperkalemia, UTI, and subclavian steal syndrome. Presented to ED on 10/03/17 with pneumonia, confirmed by imaging. Elevated troponin levels d/t demand ischemia as per previous notes.  Clinical Impression  Elevated troponin levels trending upwards still d/t demand ischemia, confirmed by Dr. Posey Pronto prior to session; MD cleared pt for PT session. Unable to obtain an accurate account of PLOF from patient, but per nursing, pt's son reports 3 weeks ago pt was ambulatory with assist/RW 1:1 to the dining room for meals; pt reports prior to recent hospital admission, she was ambulatory with a w/c and receives assist with bathing.  Pt lives at [place], confirmed by [facility] staff; staff was unable to provide any further information regarding pt's PLOF.  Currently pt is mod-max assist for bed mobility; sit to stand attempts x2 unsuccessful despite heavy verbal cueing for encouragement and that PT would provide assistance to perform task. O2 sats and HR monitored throughout session; O2 sats ranged 88-93% on 1L with activity; HR ranged 77-91bpm with activity.  Pt would benefit from skilled PT to address noted impairments and functional limitations (see below for any additional details).  Upon hospital discharge, recommend pt discharge to SNF.     Follow Up Recommendations SNF    Equipment Recommendations  Rolling walker with 5" wheels    Recommendations for Other Services       Precautions / Restrictions Precautions Precautions: Fall Restrictions Weight Bearing Restrictions: No      Mobility  Bed Mobility Overal bed mobility: Needs Assistance Bed Mobility: Supine to Sit;Sit to Supine     Supine to sit: Mod assist;Max  assist Sit to supine: Mod assist;Max assist   General bed mobility comments: Mod-max assist with all bed mobility for LE movement, trunk elevation; pt able to perform small scoots to EOB, but overall needed assist of moving hips forward with the chuck; heavy verbal and tactile cues for sequencing  Transfers                 General transfer comment: sit to stand attempted x2 trials; pt unable to generate power through LE's into standing and did not elevate from EOB despite max assist offered as well as heavy verbal and tactile cues for sequencing and encouragement  Ambulation/Gait                Stairs            Wheelchair Mobility    Modified Rankin (Stroke Patients Only)       Balance Overall balance assessment: Needs assistance Sitting-balance support: Feet supported Sitting balance-Leahy Scale: Fair Sitting balance - Comments: able to maintain seated balance at EOB with feet supported, hands in lap       Standing balance comment: deferred                             Pertinent Vitals/Pain Pain Assessment: No/denies pain    Home Living Family/patient expects to be discharged to:: Other (Comment) ([place])                      Prior Function           Comments: Unable  to obtain accurate account of PLOF from pt or [facility] staff; pt reports she "cannot get up, cannot walk" and uses a wheelchair, but has used a RW for mobility in the past; pt also reports she recieves assistance for bathing; nursing reports pt's son said pt was walking with a RW 3 weeks ago with 1:1 assist/RW to the dining room for meals     Hand Dominance        Extremity/Trunk Assessment   Upper Extremity Assessment Upper Extremity Assessment: Generalized weakness (4-/5 bilaterally with all movements)    Lower Extremity Assessment Lower Extremity Assessment: Generalized weakness (B hip flexion 4-/5; B knee flexion/extension,  DF/PF at least 3/5, unable to assess fully d/t patient reporting pain)       Communication   Communication: No difficulties;Other (comment) (legally blind in both eyes)  Cognition Arousal/Alertness: Awake/alert Behavior During Therapy: WFL for tasks assessed/performed Overall Cognitive Status: Within Functional Limits for tasks assessed                                 General Comments: Pt oriented to name, DOB, year; initially pt stated that the year was 1940, but self corrected and said it was 2018; unable to recall the month or day of the week, pt stated it is a Tuesday in November. When seated at EOB, pt became upset, stating "I can't get up, I can't move. It's my time, I'm ready to go."      General Comments      Exercises Total Joint Exercises Ankle Circles/Pumps: AROM;Both;10 reps;Supine   Assessment/Plan    PT Assessment Patient needs continued PT services  PT Problem List Decreased strength;Decreased range of motion;Decreased activity tolerance;Decreased balance;Decreased mobility       PT Treatment Interventions DME instruction;Gait training;Functional mobility training;Therapeutic activities;Therapeutic exercise;Balance training;Patient/family education    PT Goals (Current goals can be found in the Care Plan section)  Acute Rehab PT Goals Patient Stated Goal: to go home PT Goal Formulation: With patient Time For Goal Achievement: 10/18/17 Potential to Achieve Goals: Fair    Frequency Min 2X/week   Barriers to discharge        Co-evaluation               AM-PAC PT "6 Clicks" Daily Activity  Outcome Measure Difficulty turning over in bed (including adjusting bedclothes, sheets and blankets)?: A Lot Difficulty moving from lying on back to sitting on the side of the bed? : Unable Difficulty sitting down on and standing up from a chair with arms (e.g., wheelchair, bedside commode, etc,.)?: Unable Help needed moving to and from a bed to chair  (including a wheelchair)?: Total Help needed walking in hospital room?: Total Help needed climbing 3-5 steps with a railing? : Total 6 Click Score: 7    End of Session Equipment Utilized During Treatment: Gait belt;Oxygen Activity Tolerance: Other (comment) (patient limited by generalized weakness) Patient left: in bed;with call bell/phone within reach;with bed alarm set;with nursing/sitter in room Nurse Communication: Mobility status (need for external catheter to be reapplied) PT Visit Diagnosis: Other abnormalities of gait and mobility (R26.89);Muscle weakness (generalized) (M62.81)    Time: 4742-5956 PT Time Calculation (min) (ACUTE ONLY): 46 min   Charges:         PT G CodesWetzel Bjornstad, SPT 10/04/2017, 4:14 PM

## 2017-10-04 NOTE — Progress Notes (Signed)
Medications administered by student RN [id number] with supervision of Clinical Instructor Peggye Poon MSN, RN-BC or patient's assigned RN.   

## 2017-10-04 NOTE — Progress Notes (Signed)
Speech Therapy Note: reviewed chart notes, labs; consulted NSG re: pt's status. NSG reported was tolerating po's adequately w/ no decline in status; similar w/ Pills swallowing(recommended w/ Ensure).  ST services will f/u next 1-2 days for toleration of diet; aspiration precautions.    Orinda Kenner, [place], CCC-SLP

## 2017-10-04 NOTE — Progress Notes (Signed)
*  PRELIMINARY RESULTS* Echocardiogram 2D Echocardiogram has been performed.  Sherrie Sport 10/04/2017, 11:40 AM

## 2017-10-04 NOTE — NC FL2 (Signed)
[place] LEVEL OF CARE SCREENING TOOL     IDENTIFICATION  Patient Name: Kathleen Hampton Birthdate: 05-01-1922 Sex: female Admission Date (Current Location): 10/03/2017  [place] and [place] Number:  Engineering geologist and Address:  [facility], [address], [place], [place] [address]      Provider Number: [id number]  Attending Physician Name and Address:  Dustin Flock, MD  Relative Name and Phone Number:       Current Level of Care: Hospital Recommended Level of Care: [place] Prior Approval Number:    Date Approved/Denied:   PASRR Number:  [id number])  Discharge Plan: SNF    Current Diagnoses: Patient Active Problem List   Diagnosis Date Noted  . PNA (pneumonia) 10/03/2017  . Acute gastroenteritis 08/17/2015  . Dehydration 08/17/2015  . Sepsis ([place]) 08/14/2015  . UTI (lower urinary tract infection) 08/14/2015  . Hyperkalemia 08/14/2015  . Type II diabetes mellitus ([place]) 08/14/2015  . HTN (hypertension) 08/14/2015  . HLD (hyperlipidemia) 08/14/2015    Orientation RESPIRATION BLADDER Height & Weight     Self, Place  O2 (1 Liter Oxygen. ) Incontinent Weight: 156 lb 8.4 oz (71 kg) Height:  5\' 5"  (165.1 cm)  BEHAVIORAL SYMPTOMS/MOOD NEUROLOGICAL BOWEL NUTRITION STATUS      Continent Diet (Regular Diet )  AMBULATORY STATUS COMMUNICATION OF NEEDS Skin   Extensive Assist Verbally PU Stage and Appropriate Care (Pressure Ulcer Stage 2 on Sacrum. )                       Personal Care Assistance Level of Assistance  Bathing, Feeding, Dressing Bathing Assistance: Limited assistance Feeding assistance: Independent Dressing Assistance: Limited assistance     Functional Limitations Info  Sight, Hearing, Speech Sight Info: Impaired Hearing Info: Impaired Speech Info: Adequate    SPECIAL CARE FACTORS FREQUENCY  PT (By licensed PT), OT (By licensed OT)     PT Frequency:  (5) OT Frequency:   (5)            Contractures      Additional Factors Info  Code Status, Allergies Code Status Info:  (DNR ) Allergies Info:  (Penicillins, Lodine Etodolac)           Current Medications (10/04/2017):  This is the current hospital active medication list Current Facility-Administered Medications  Medication Dose Route Frequency Provider Last Rate Last Dose  . 0.9 %  sodium chloride infusion  250 mL Intravenous PRN Dustin Flock, MD      . acetaminophen (TYLENOL) tablet 650 mg  650 mg Oral Q6H PRN Dustin Flock, MD   650 mg at 10/03/17 1343   Or  . acetaminophen (TYLENOL) suppository 650 mg  650 mg Rectal Q6H PRN Dustin Flock, MD      . aspirin EC tablet 81 mg  81 mg Oral Daily Dustin Flock, MD   81 mg at 10/04/17 0911  . benzonatate (TESSALON) capsule 200 mg  200 mg Oral TID Dustin Flock, MD   200 mg at 10/04/17 1519  . cholecalciferol (VITAMIN D) tablet 1,000 Units  1,000 Units Oral Daily Dustin Flock, MD   1,000 Units at 10/04/17 0911  . enoxaparin (LOVENOX) injection 30 mg  30 mg Subcutaneous Q24H Dustin Flock, MD   30 mg at 10/03/17 2059  . guaiFENesin (MUCINEX) 12 hr tablet 600 mg  600 mg Oral BID Dustin Flock, MD   600 mg at 10/04/17 0912  . guaiFENesin-dextromethorphan (ROBITUSSIN DM) 100-10  MG/5ML syrup 5 mL  5 mL Oral Q4H PRN Dustin Flock, MD   5 mL at 10/04/17 0202  . ipratropium-albuterol (DUONEB) 0.5-2.5 (3) MG/3ML nebulizer solution 3 mL  3 mL Nebulization Q6H Dustin Flock, MD   3 mL at 10/04/17 1316  . [START ON 10/06/2017] levofloxacin (LEVAQUIN) IVPB 500 mg  500 mg Intravenous Q48H Dustin Flock, MD      . levothyroxine (SYNTHROID, LEVOTHROID) tablet 25 mcg  25 mcg Oral QAC breakfast Dustin Flock, MD   25 mcg at 10/04/17 0912  . ondansetron (ZOFRAN) tablet 4 mg  4 mg Oral Q6H PRN Dustin Flock, MD       Or  . ondansetron [facility]) injection 4 mg  4 mg Intravenous Q6H PRN Dustin Flock, MD   4 mg at 10/03/17 1421  . sodium chloride  flush (NS) 0.9 % injection 3 mL  3 mL Intravenous Q12H Dustin Flock, MD   3 mL at 10/04/17 0913  . sodium chloride flush (NS) 0.9 % injection 3 mL  3 mL Intravenous PRN Dustin Flock, MD      . vitamin B-12 (CYANOCOBALAMIN) tablet 1,000 mcg  1,000 mcg Oral Daily Dustin Flock, MD   1,000 mcg at 10/04/17 0911     Discharge Medications: Please see discharge summary for a list of discharge medications.  Relevant Imaging Results:  Relevant Lab Results:   Additional Information  (SSN: [id number])  Quanah Majka, Veronia Beets, LCSW

## 2017-10-04 NOTE — Plan of Care (Signed)
Dr. Text to inform of critical troponin. (0.11). No further orders at this time.

## 2017-10-04 NOTE — Progress Notes (Signed)
[place] [facility]                                                                                                                                                                                  Patient Demographics   Kathleen Hampton, is a 83 y.o. female, DOB - 1922/10/03, RAQ:[id number]  Admit date - 10/03/2017   Admitting Physician Dustin Flock, MD  Outpatient Primary MD for the patient is System, Pcp Not In   LOS - 1  Subjective: Patient at the time of my visit was noted to have low oxygen had to be placed back on oxygen. And her blood pressure was low She states that she is feeling better  Review of Systems:   CONSTITUTIONAL: No documented fever. No fatigue, weakness. No weight gain, no weight loss.  EYES: No blurry or double vision.  ENT: No tinnitus. No postnasal drip. No redness of the oropharynx.  RESPIRATORY: No cough, no wheeze, no hemoptysis. No dyspnea.  CARDIOVASCULAR: No chest pain. No orthopnea. No palpitations. No syncope.  GASTROINTESTINAL: No nausea, no vomiting or diarrhea. No abdominal pain. No melena or hematochezia.  GENITOURINARY: No dysuria or hematuria.  ENDOCRINE: No polyuria or nocturia. No heat or cold intolerance.  HEMATOLOGY: No anemia. No bruising. No bleeding.  INTEGUMENTARY: No rashes. No lesions.  MUSCULOSKELETAL: No arthritis. No swelling. No gout.  NEUROLOGIC: No numbness, tingling, or ataxia. No seizure-type activity.  PSYCHIATRIC: No anxiety. No insomnia. No ADD.    Vitals:   Vitals:   10/04/17 1147 10/04/17 1151 10/04/17 1152 10/04/17 1317  BP: (!) 107/40     Pulse:      Resp:      Temp:      TempSrc:      SpO2: 95% (!) 88% 90% 91%  Weight:      Height:        Wt Readings from Last 3 Encounters:  10/03/17 156 lb 8.4 oz (71 kg)  08/14/15 194 lb 8 oz (88.2 kg)     Intake/Output Summary (Last 24 hours) at 10/04/17 1326 Last data filed at 10/04/17 1020  Gross per 24 hour  Intake               957 ml  Output              200 ml  Net              757 ml    Physical Exam:   GENERAL: Pleasant-appearing in no apparent distress.  HEAD, EYES, EARS, NOSE AND THROAT: Atraumatic, normocephalic. Extraocular muscles are intact. Pupils equal and reactive to light. Sclerae anicteric. No conjunctival injection. No  oro-pharyngeal erythema.  NECK: Supple. There is no jugular venous distention. No bruits, no lymphadenopathy, no thyromegaly.  HEART: Regular rate and rhythm,. Positive systolic murmurs, no rubs, no clicks.  LUNGS: Clear to auscultation bilaterally. No rales or rhonchi. No wheezes.  ABDOMEN: Soft, flat, nontender, nondistended. Has good bowel sounds. No hepatosplenomegaly appreciated.  EXTREMITIES: No evidence of any cyanosis, clubbing, or peripheral edema.  +2 pedal and radial pulses bilaterally.  NEUROLOGIC: The patient is alert, awake, and oriented x3 with no focal motor or sensory deficits appreciated bilaterally.  SKIN: Moist and warm with no rashes appreciated.  Psych: Not anxious, depressed LN: No inguinal LN enlargement    Antibiotics   Anti-infectives    Start     Dose/Rate Route Frequency Ordered Stop   10/06/17 1000  levofloxacin (LEVAQUIN) IVPB 500 mg     500 mg 100 mL/hr over 60 Minutes Intravenous Every 48 hours 10/03/17 1110     10/05/17 1000  Levofloxacin (LEVAQUIN) IVPB 250 mg  Status:  Discontinued     250 mg 50 mL/hr over 60 Minutes Intravenous Every 24 hours 10/03/17 0948 10/03/17 1111   10/04/17 1000  levofloxacin (LEVAQUIN) IVPB 500 mg  Status:  Discontinued     500 mg 100 mL/hr over 60 Minutes Intravenous  Once 10/03/17 0834 10/03/17 1111   10/04/17 1000  levofloxacin (LEVAQUIN) IVPB 750 mg     750 mg 100 mL/hr over 90 Minutes Intravenous  Once 10/03/17 1110 10/04/17 1043   10/03/17 0715  aztreonam (AZACTAM) 2 g in dextrose 5 % 50 mL IVPB     2 g 100 mL/hr over 30 Minutes Intravenous  Once 10/03/17 0704 10/03/17 0814   10/03/17 0715  vancomycin  (VANCOCIN) IVPB 1000 mg/200 mL premix     1,000 mg 200 mL/hr over 60 Minutes Intravenous  Once 10/03/17 0704 10/03/17 0935      Medications   Scheduled Meds: . aspirin EC  81 mg Oral Daily  . benzonatate  200 mg Oral TID  . cholecalciferol  1,000 Units Oral Daily  . enoxaparin (LOVENOX) injection  30 mg Subcutaneous Q24H  . guaiFENesin  600 mg Oral BID  . ipratropium-albuterol  3 mL Nebulization Q6H  . levothyroxine  25 mcg Oral QAC breakfast  . sodium chloride flush  3 mL Intravenous Q12H  . vitamin B-12  1,000 mcg Oral Daily   Continuous Infusions: . sodium chloride    . [START ON 10/06/2017] levofloxacin (LEVAQUIN) IV     PRN Meds:.sodium chloride, acetaminophen **OR** acetaminophen, guaiFENesin-dextromethorphan, ondansetron **OR** ondansetron (ZOFRAN) IV, sodium chloride flush   Data Review:   Micro Results Recent Results (from the past 240 hour(s))  Blood Culture (routine x 2)     Status: None (Preliminary result)   Collection Time: 10/03/17  7:08 AM  Result Value Ref Range Status   Specimen Description BLOOD RT [facility]  Final   Special Requests   Final    BOTTLES DRAWN AEROBIC AND ANAEROBIC Blood Culture results may not be optimal due to an excessive volume of blood received in culture bottles   Culture NO GROWTH 1 DAY  Final   Report Status PENDING  Incomplete  Blood Culture (routine x 2)     Status: None (Preliminary result)   Collection Time: 10/03/17  7:08 AM  Result Value Ref Range Status   Specimen Description BLOOD LT ARM  Final   Special Requests   Final    BOTTLES DRAWN AEROBIC AND ANAEROBIC Blood Culture adequate volume  Culture NO GROWTH 1 DAY  Final   Report Status PENDING  Incomplete  MRSA PCR Screening     Status: None   Collection Time: 10/03/17  3:20 PM  Result Value Ref Range Status   MRSA by PCR NEGATIVE NEGATIVE Final    Comment:        The GeneXpert MRSA Assay (FDA approved for NASAL specimens only), is one component of a comprehensive MRSA  colonization surveillance program. It is not intended to diagnose MRSA infection nor to guide or monitor treatment for MRSA infections.     Radiology Reports Dg Chest 2 View  Result Date: 10/03/2017 CLINICAL DATA:  Progressive cough for 1 week. Hypoxia. Shortness of breath. History of breast cancer, diabetes. EXAM: CHEST  2 VIEW COMPARISON:  Chest radiograph August 14, 2015 FINDINGS: Cardiomediastinal silhouette is normal. Calcified aortic knob. Chronic interstitial changes. Faint airspace opacity RIGHT upper quadrant. Similar LEFT lung base pleural thickening and strandy densities. No pneumothorax. Osteopenia. IMPRESSION: RIGHT upper lobe pneumonia versus prominent RIGHT anterior first rib. Chronic interstitial changes. Followup PA and lateral chest X-ray is recommended in 3-4 weeks following trial of antibiotic therapy to ensure resolution and exclude underlying malignancy. LEFT lung base atelectasis/scarring. Aortic Atherosclerosis (ICD10-I70.0). Electronically Signed   By: Elon Alas M.D.   On: 10/03/2017 06:37     CBC  Recent Labs Lab 10/03/17 0544 10/04/17 0502  WBC 15.0* 14.1*  HGB 11.7* 10.4*  HCT 36.1 32.4*  PLT 205 163  MCV 93.2 93.3  MCH 30.3 30.1  MCHC 32.5 32.3  RDW 14.3 14.5  LYMPHSABS 5.5*  --   MONOABS 1.3*  --   EOSABS 0.5  --   BASOSABS 0.1  --     Chemistries   Recent Labs Lab 10/03/17 0544 10/04/17 0502  NA 137 137  K 4.6 4.4  CL 98* 99*  CO2 28 31  GLUCOSE 103* 95  BUN 31* 33*  CREATININE 1.53* 1.46*  CALCIUM 8.7* 8.4*   ------------------------------------------------------------------------------------------------------------------ estimated creatinine clearance is 23.8 mL/min (A) (by C-G formula based on SCr of 1.46 mg/dL (H)). ------------------------------------------------------------------------------------------------------------------ No results for input(s): HGBA1C in the last 72  hours. ------------------------------------------------------------------------------------------------------------------ No results for input(s): CHOL, HDL, LDLCALC, TRIG, CHOLHDL, LDLDIRECT in the last 72 hours. ------------------------------------------------------------------------------------------------------------------ No results for input(s): TSH, T4TOTAL, T3FREE, THYROIDAB in the last 72 hours.  Invalid input(s): FREET3 ------------------------------------------------------------------------------------------------------------------ No results for input(s): VITAMINB12, FOLATE, FERRITIN, TIBC, IRON, RETICCTPCT in the last 72 hours.  Coagulation profile No results for input(s): INR, PROTIME in the last 168 hours.  No results for input(s): DDIMER in the last 72 hours.  Cardiac Enzymes  Recent Labs Lab 10/03/17 0544 10/03/17 0950 10/04/17 0502  TROPONINI 0.08* 0.09* 0.11*   ------------------------------------------------------------------------------------------------------------------ Invalid input(s): POCBNP    Assessment & Plan    1. Pneumonia WBC count is 15,000  Continue therapy IV Levaquin  Continue mucolytics  Antitussives  Continue nebulizer  2. hypotension - ivf bolus given  3. Hypothyroidism continue Synthroid and check a TSH  4. Systolic murmur patient was supposed to have a echo as outpatient I will order an echo of the heart  5. Essential hypertension patient currently not on any medications will monitor blood pressure  6. . Misc: lovenox for dvt proph       Code Status Orders        Start     Ordered   10/03/17 0953  Do not attempt resuscitation (DNR)  Continuous    Question Answer Comment  In the  event of cardiac or respiratory ARREST Do not call a "code blue"   In the event of cardiac or respiratory ARREST Do not perform Intubation, CPR, defibrillation or ACLS   In the event of cardiac or respiratory ARREST Use medication by  any route, position, wound care, and other measures to relive pain and suffering. May use oxygen, suction and manual treatment of airway obstruction as needed for comfort.      10/03/17 4492    Code Status History    Date Active Date Inactive Code Status Order ID Comments User Context   08/14/2015  8:41 AM 08/17/2015  6:14 PM DNR [id number]  Lance Coon, MD Inpatient    Advance Directive Documentation     Most Recent Value  Type of Advance Directive  Out of facility DNR (pink MOST or yellow form), Healthcare Power of Attorney  Pre-existing out of facility DNR order (yellow form or pink MOST form)  Yellow form placed in chart (order not valid for inpatient use)  "MOST" Form in Place?  -           Consults  none   DVT Prophylaxis  Lovenox   Lab Results  Component Value Date   PLT 163 10/04/2017     Time Spent in minutes 71min Greater than 50% of time spent in care coordination and counseling patient regarding the condition and plan of care.   Dustin Flock M.D on 10/04/2017 at 1:26 PM  Between 7am to 6pm - Pager - [phone]  After 6pm go to [email or web address] - password [email or web address] [place] [facility]   Office  [phone]

## 2017-10-04 NOTE — Progress Notes (Signed)
Initial Nutrition Assessment  DOCUMENTATION CODES:   Non-severe (moderate) malnutrition in context of chronic illness  INTERVENTION:  Provide Magic cup TID with meals, each supplement provides 290 kcal and 9 grams of protein.  Encouraged intake of foods patient enjoys.  Continue 1:1 assistance at meals.  NUTRITION DIAGNOSIS:   Malnutrition (Moderate) related to chronic illness (advanced age, decreasing appetite and desire to eat) as evidenced by moderate depletions of muscle mass, moderate depletion of body fat.  GOAL:   Patient will meet greater than or equal to 90% of their needs  MONITOR:   PO intake, Supplement acceptance, Labs, Weight trends  REASON FOR ASSESSMENT:   Malnutrition Screening Tool    ASSESSMENT:   83 year old female with PMHx of HTN, DM type 2, hypothyroidism, hx breast cancer s/p right mastectomy, HLD, blindness who was admitted with PNA, hypotension.   -Following SLP on 10/24 patient was placed on dysphagia 3 diet with thin liquids. Son reports that he requested today her diet be liberalized because she wants to eat foods she enjoys and not be restricted. Today diet was liberalized to regular with thin liquids.  Spoke with patient and her son at bedside. Son provided most of history. He reports patient has been living at [facility] for 8 years now. She is now reporting she is "giving up" and is only eating just enough to get by now. For the past year her intake has significantly declined. She enjoys breakfast. She will either have cereal with milk, banana, grapefruit, and orange juice, or she will eat eggs with sausage. She typically skips lunch or will have bites. Dinner is usually tomato soup or another soup she enjoys, but if she does not like the soup being served she will typically skip dinner as well. She does not like Ensure or Boost and does not want to drink those. Son reports she does enjoy ice cream so she will probably enjoy some Magic Cup. Son  reports that her functional status has decreased in the past few weeks. Three weeks ago she was able to walk to the dining room from her room, but now has to go down in a wheelchair and is essentially bed- and wheel-chair bound.  Patient used to weigh 212 lbs 8 years ago when she went to live at [facility]. Approximately one year ago her weight was 195 lbs. Noted in chart patient was 194.5 lbs on 08/14/2015. Over the past 2 years she has lost 38 lbs (19.6% body weight), which is not significant for time frame.  Medications reviewed and include: vitamin D 1000 units daily, levothyroxine, vitamin B12 1000 micrograms daily, Levaquin.  Labs reviewed: Chloride 99, BUN 33, Creatinine 1.46, elevated Troponin.  Nutrition-Focused physical exam completed. Findings are moderate-severe fat depletion (moderate depletion of upper arm region and thoracic/lumbar region; severe depletion of orbital region), moderate-severe muscle depletion (mdoerate depletion of clavicle bone region, clavicle/acromion bone region, scapular bone region, patellar region, anterior thigh region, posterior calf region; severe depletion of temple region and dorsal hand region), and mild pitting edema in bilateral upper extremities.  Diet Order:  Diet regular Room service appropriate? Yes; Fluid consistency: Thin  Skin:  Wound (see comment) (Stg II sacrum, MSAD to abdomen and buttocks)  Last BM:  PTA (10/02/2017 per chart)  Height:   Ht Readings from Last 1 Encounters:  10/03/17 5\' 5"  (1.651 m)    Weight:   Wt Readings from Last 1 Encounters:  10/03/17 156 lb 8.4 oz (71 kg)  Ideal Body Weight:  56.8 kg  BMI:  Body mass index is 26.05 kg/m.  Estimated Nutritional Needs:   Kcal:  1350-1570 (MSJ x 1.2-1.4)  Protein:  80-92 grams (1.1-1.3 grams/kg)  Fluid:  1.8 L/day (25 ml/kg)  EDUCATION NEEDS:   No education needs identified at this time  Willey Blade, [place], [place], [place] Office: [phone] Pager:  [phone] After Hours/Weekend Pager: [phone]

## 2017-10-04 NOTE — Progress Notes (Signed)
Trop 0.11.  Paged [facility] doc, response pending. Dorna Bloom RN

## 2017-10-05 ENCOUNTER — Inpatient Hospital Stay: Payer: Medicare Other

## 2017-10-05 ENCOUNTER — Encounter
Admission: RE | Admit: 2017-10-05 | Discharge: 2017-10-05 | Disposition: A | Discharge status: Home / Self Care | Payer: Medicare Other | Source: Ambulatory Visit | Attending: Internal Medicine | Admitting: Internal Medicine

## 2017-10-05 DIAGNOSIS — L8990 Pressure ulcer of unspecified site, unspecified stage: Secondary | ICD-10-CM | POA: Insufficient documentation

## 2017-10-05 MED ORDER — BENZONATATE 200 MG PO CAPS: 200.0000 mg | ORAL_CAPSULE | Freq: Three times a day (TID) | ORAL | 0 refills | Status: AC

## 2017-10-05 MED ORDER — IPRATROPIUM-ALBUTEROL 0.5-2.5 (3) MG/3ML IN SOLN: 3.0000 mL | Freq: Four times a day (QID) | RESPIRATORY_TRACT | Status: DC | PRN

## 2017-10-05 MED ORDER — METHYLPREDNISOLONE SODIUM SUCC 125 MG IJ SOLR
60.0000 mg | Freq: Two times a day (BID) | INTRAMUSCULAR | Status: DC
  Administered 2017-10-05: 60 mg via INTRAVENOUS
  Administered 2017-10-05: 08:00:00 via INTRAVENOUS
  Administered 2017-10-06: 09:00:00 60 mg via INTRAVENOUS
  Filled 2017-10-05 (×3): qty 2

## 2017-10-05 MED ORDER — LEVOFLOXACIN 500 MG PO TABS: 500.0000 mg | ORAL_TABLET | ORAL | Status: DC

## 2017-10-05 MED ORDER — GUAIFENESIN ER 600 MG PO TB12: 600.0000 mg | ORAL_TABLET | Freq: Two times a day (BID) | ORAL | Status: AC

## 2017-10-05 MED ORDER — PANTOPRAZOLE SODIUM 40 MG PO TBEC
40.0000 mg | DELAYED_RELEASE_TABLET | Freq: Every day | ORAL | Status: DC
  Administered 2017-10-05 – 2017-10-06 (×2): 40 mg via ORAL
  Filled 2017-10-05 (×2): qty 1

## 2017-10-05 MED ORDER — FLUTICASONE PROPIONATE 50 MCG/ACT NA SUSP
2.0000 | Freq: Every day | NASAL | Status: DC
  Administered 2017-10-05: 16:00:00 2 via NASAL
  Filled 2017-10-05: qty 16

## 2017-10-05 MED ORDER — IPRATROPIUM-ALBUTEROL 0.5-2.5 (3) MG/3ML IN SOLN: 3.0000 mL | Freq: Four times a day (QID) | RESPIRATORY_TRACT | Status: AC | PRN

## 2017-10-05 MED ORDER — LEVOFLOXACIN 750 MG PO TABS
750.0000 mg | ORAL_TABLET | ORAL | Status: DC
  Administered 2017-10-06: 09:00:00 750 mg via ORAL
  Filled 2017-10-05: qty 1

## 2017-10-05 MED ORDER — PANTOPRAZOLE SODIUM 40 MG PO TBEC: 40.0000 mg | DELAYED_RELEASE_TABLET | Freq: Every day | ORAL | Status: AC

## 2017-10-05 MED ORDER — GUAIFENESIN-DM 100-10 MG/5ML PO SYRP: 5.0000 mL | ORAL_SOLUTION | ORAL | 0 refills | Status: AC | PRN

## 2017-10-05 MED ORDER — IPRATROPIUM-ALBUTEROL 0.5-2.5 (3) MG/3ML IN SOLN
3.0000 mL | Freq: Three times a day (TID) | RESPIRATORY_TRACT | Status: DC
  Administered 2017-10-05 – 2017-10-06 (×3): 3 mL via RESPIRATORY_TRACT
  Filled 2017-10-05 (×3): qty 3

## 2017-10-05 MED ORDER — FLUTICASONE PROPIONATE 50 MCG/ACT NA SUSP: 2.0000 | Freq: Every day | NASAL | 2 refills | Status: AC

## 2017-10-05 MED ORDER — LEVOFLOXACIN 250 MG PO TABS: 250.0000 mg | ORAL_TABLET | Freq: Every day | ORAL | 0 refills | Status: DC

## 2017-10-05 NOTE — Discharge Instructions (Signed)
[facility] [place] [facility] ° °DIET:  °Cardiac diet ° °DISCHARGE CONDITION:  °Stable ° °ACTIVITY:  °Activity as tolerated ° °OXYGEN:  °Home Oxygen: No. °  °Oxygen Delivery: room air ° °DISCHARGE LOCATION:  °home  ° ° °ADDITIONAL DISCHARGE INSTRUCTION: ° ° °If you experience worsening of your admission symptoms, develop shortness of breath, life threatening emergency, suicidal or homicidal thoughts you must seek medical attention immediately by calling 911 or calling your MD immediately  if symptoms less severe. ° °You Must read complete instructions/literature along with all the possible adverse reactions/side effects for all the Medicines you take and that have been prescribed to you. Take any new Medicines after you have completely understood and accpet all the possible adverse reactions/side effects.  ° °Please note ° °You were cared for by a hospitalist during your hospital stay. If you have any questions about your discharge medications or the care you received while you were in the hospital after you are discharged, you can call the unit and asked to speak with the hospitalist on call if the hospitalist that took care of you is not available. Once you are discharged, your primary care physician will handle any further medical issues. Please note that NO REFILLS for any discharge medications will be authorized once you are discharged, as it is imperative that you return to your primary care physician (or establish a relationship with a primary care physician if you do not have one) for your aftercare needs so that they can reassess your need for medications and monitor your lab values. ° ° °

## 2017-10-05 NOTE — Progress Notes (Signed)
Pharmacist - Prescriber Communication  Levofloxacin dose modified from 500 mg po Q48H to 750 mg po Q48H due to creatinine clearance 20 to 49 mL/min.   Schylar Allard A. [place], [place].D., BCPS Clinical Pharmacist 10/05/2017 13:58

## 2017-10-05 NOTE — Progress Notes (Signed)
Speech Therapy Note: reviewed chart notes; consulted NSG to discuss pt's status and toleration of diet, pills. Pt has been eating well at her meals today and swallowing pills in Puree adequately per NSG report. NSG denied any difficulty swallowing; cough has not worsened and in fact, pt is feeling "better" today. Noted MD notes; chest CT. Son requested a regular diet for pt to avoid restrictions for his Mother; Dietician following as needed.  Pt appears at her baseline w/ regard to toleration of diet. No further skilled ST services indicated at this time. NSG to reconsult if any decline in status while admitted. NSG agreed and will continue w/ general aspiration precautions and Pills in puree.    Orinda Kenner, [place], CCC-SLP

## 2017-10-05 NOTE — Clinical Social Work Note (Signed)
Clinical Social Work Assessment  Patient Details  Name: Kathleen Hampton MRN: [id number] Date of Birth: Jul 17, 1922  Date of referral:  10/05/17               Reason for consult:  Facility Placement                Permission sought to share information with:  Chartered certified accountant granted to share information::  Yes, Verbal Permission Granted  Name::      [place]::   Kathleen Hampton   Relationship::     Contact Information:     Housing/Transportation Living arrangements for the past 2 months:  [place] of Information:  Patient, Adult Children Patient Interpreter Needed:  None Criminal Activity/Legal Involvement Pertinent to Current Situation/Hospitalization:  No - Comment as needed Significant Relationships:  Adult Children Lives with:  Self Do you feel safe going back to the place where you live?  Yes Need for family participation in patient care:  Yes (Comment)  Care giving concerns:  Patient lives at [facility] independent living alone.    Social Worker assessment / plan:  Holiday representative (CSW) received SNF consult. PT is recommending SNF. CSW met with patient alone at bedside to discuss D/C plan. Patient was alert and oriented X4 and was laying in the bed. CSW introduced self and explained role of CSW department. Patient reported that she lives alone at [facility] and is agreeable to SNF search in [place]. CSW explained that medicare requires a 3 night inpatient qualifying stay at a hospital in order to pay for SNF. Patient was admitted to inpatient on 10/03/17. Patient verbalized her understanding and stated that her son Kathleen Hampton will make the SNF choice. FL2 complete and faxed out.   CSW contacted patient's son Kathleen Hampton and presented bed offers. Son chose [facility]. Plan is for patient to D/C to [facility] tomorrow pending medical clearance. Per [facility] admissions coordinator at [facility]  patient can come tomorrow to room 203-B. RN will call report at [phone]. CSW sent D/C summary to [facility] via [place] today. CSW will continue to follow and assist as needed.   Employment status:  Retired Forensic scientist:  Medicare PT Recommendations:  [place] / Referral to community resources:  [place]  Patient/Family's Response to care:  Patient's son is agreeable for patient to D/C to [facility].   Patient/Family's Understanding of and Emotional Response to Diagnosis, Current Treatment, and Prognosis:  Patient and her son were very pleasant and thanked CSW for assistance.   Emotional Assessment Appearance:  Appears stated age Attitude/Demeanor/Rapport:    Affect (typically observed):  Accepting, Adaptable, Pleasant Orientation:  Oriented to Self, Oriented to Place, Oriented to  Time, Oriented to Situation Alcohol / Substance use:  Not Applicable Psych involvement (Current and /or in the community):  No (Comment)  Discharge Needs  Concerns to be addressed:  Discharge Planning Concerns Readmission within the last 30 days:  No Current discharge risk:  Dependent with Mobility Barriers to Discharge:  Continued Medical Work up   [employer], Veronia Beets, LCSW 10/05/2017, 3:20 PM

## 2017-10-05 NOTE — Care Management (Signed)
Patient active with [facility] for SN

## 2017-10-05 NOTE — Progress Notes (Signed)
[place] at [facility]                                                                                                                                                                                  Patient Demographics   Grenelefe, is a 83 y.o. female, DOB - 12-Apr-1922, NWG:[id number]  Admit date - 10/03/2017   Admitting Physician Dustin Flock, MD  Outpatient Primary MD for the patient is System, Pcp Not In   LOS - 2  Subjective: Still has cough with congestion son reports that this has been going on for a while  Review of Systems:   CONSTITUTIONAL: No documented fever. No fatigue, weakness. No weight gain, no weight loss.  EYES: No blurry or double vision.  ENT: No tinnitus. No postnasal drip. No redness of the oropharynx.  RESPIRATORY: No cough, no wheeze, no hemoptysis. No dyspnea.  CARDIOVASCULAR: No chest pain. No orthopnea. No palpitations. No syncope.  GASTROINTESTINAL: No nausea, no vomiting or diarrhea. No abdominal pain. No melena or hematochezia.  GENITOURINARY: No dysuria or hematuria.  ENDOCRINE: No polyuria or nocturia. No heat or cold intolerance.  HEMATOLOGY: No anemia. No bruising. No bleeding.  INTEGUMENTARY: No rashes. No lesions.  MUSCULOSKELETAL: No arthritis. No swelling. No gout.  NEUROLOGIC: No numbness, tingling, or ataxia. No seizure-type activity.  PSYCHIATRIC: No anxiety. No insomnia. No ADD.    Vitals:   Vitals:   10/05/17 0228 10/05/17 0508 10/05/17 1035 10/05/17 1300  BP:  (!) 115/50 (!) 126/42 (!) 122/45  Pulse:  64 82 70  Resp:  20 15 20   Temp:  98.1 F (36.7 C) 98.1 F (36.7 C) 98 F (36.7 C)  TempSrc:  Oral Oral   SpO2: 92% 93% 92% 97%  Weight:      Height:        Wt Readings from Last 3 Encounters:  10/03/17 156 lb 8.4 oz (71 kg)  08/14/15 194 lb 8 oz (88.2 kg)     Intake/Output Summary (Last 24 hours) at 10/05/17 1323 Last data filed at 10/05/17 1004  Gross per 24 hour  Intake               240 ml  Output                0 ml  Net              240 ml    Physical Exam:   GENERAL: Pleasant-appearing in no apparent distress.  HEAD, EYES, EARS, NOSE AND THROAT: Atraumatic, normocephalic. Extraocular muscles are intact. Pupils equal and reactive to light. Sclerae anicteric. No conjunctival injection. No oro-pharyngeal erythema.  NECK: Supple.  There is no jugular venous distention. No bruits, no lymphadenopathy, no thyromegaly.  HEART: Regular rate and rhythm,. Positive systolic murmurs, no rubs, no clicks.  LUNGS: Clear to auscultation bilaterally. No rales or rhonchi. No wheezes.  ABDOMEN: Soft, flat, nontender, nondistended. Has good bowel sounds. No hepatosplenomegaly appreciated.  EXTREMITIES: No evidence of any cyanosis, clubbing, or peripheral edema.  +2 pedal and radial pulses bilaterally.  NEUROLOGIC: The patient is alert, awake, and oriented x3 with no focal motor or sensory deficits appreciated bilaterally.  SKIN: Moist and warm with no rashes appreciated.  Psych: Not anxious, depressed LN: No inguinal LN enlargement    Antibiotics   Anti-infectives    Start     Dose/Rate Route Frequency Ordered Stop   10/06/17 1000  levofloxacin (LEVAQUIN) IVPB 500 mg     500 mg 100 mL/hr over 60 Minutes Intravenous Every 48 hours 10/03/17 1110     10/05/17 1000  Levofloxacin (LEVAQUIN) IVPB 250 mg  Status:  Discontinued     250 mg 50 mL/hr over 60 Minutes Intravenous Every 24 hours 10/03/17 0948 10/03/17 1111   10/05/17 0000  levofloxacin (LEVAQUIN) 250 MG tablet     250 mg Oral Daily 10/05/17 1223 10/09/17 2359   10/04/17 1000  levofloxacin (LEVAQUIN) IVPB 500 mg  Status:  Discontinued     500 mg 100 mL/hr over 60 Minutes Intravenous  Once 10/03/17 0834 10/03/17 1111   10/04/17 1000  levofloxacin (LEVAQUIN) IVPB 750 mg     750 mg 100 mL/hr over 90 Minutes Intravenous  Once 10/03/17 1110 10/04/17 1043   10/03/17 0715  aztreonam (AZACTAM) 2 g in dextrose 5 % 50 mL IVPB      2 g 100 mL/hr over 30 Minutes Intravenous  Once 10/03/17 0704 10/03/17 0814   10/03/17 0715  vancomycin (VANCOCIN) IVPB 1000 mg/200 mL premix     1,000 mg 200 mL/hr over 60 Minutes Intravenous  Once 10/03/17 0704 10/03/17 0935      Medications   Scheduled Meds: . aspirin EC  81 mg Oral Daily  . benzonatate  200 mg Oral TID  . cholecalciferol  1,000 Units Oral Daily  . enoxaparin (LOVENOX) injection  30 mg Subcutaneous Q24H  . fluticasone  2 spray Each Nare Daily  . guaiFENesin  600 mg Oral BID  . ipratropium-albuterol  3 mL Nebulization Q6H  . levothyroxine  25 mcg Oral QAC breakfast  . methylPREDNISolone (SOLU-MEDROL) injection  60 mg Intravenous Q12H  . pantoprazole  40 mg Oral Daily  . sodium chloride flush  3 mL Intravenous Q12H  . vitamin B-12  1,000 mcg Oral Daily   Continuous Infusions: . sodium chloride    . [START ON 10/06/2017] levofloxacin (LEVAQUIN) IV     PRN Meds:.sodium chloride, acetaminophen **OR** acetaminophen, guaiFENesin-dextromethorphan, ondansetron **OR** ondansetron (ZOFRAN) IV, sodium chloride flush   Data Review:   Micro Results Recent Results (from the past 240 hour(s))  Blood Culture (routine x 2)     Status: None (Preliminary result)   Collection Time: 10/03/17  7:08 AM  Result Value Ref Range Status   Specimen Description BLOOD RT [facility]  Final   Special Requests   Final    BOTTLES DRAWN AEROBIC AND ANAEROBIC Blood Culture results may not be optimal due to an excessive volume of blood received in culture bottles   Culture NO GROWTH 2 DAYS  Final   Report Status PENDING  Incomplete  Blood Culture (routine x 2)     Status: None (Preliminary  result)   Collection Time: 10/03/17  7:08 AM  Result Value Ref Range Status   Specimen Description BLOOD LT ARM  Final   Special Requests   Final    BOTTLES DRAWN AEROBIC AND ANAEROBIC Blood Culture adequate volume   Culture NO GROWTH 2 DAYS  Final   Report Status PENDING  Incomplete  MRSA PCR Screening      Status: None   Collection Time: 10/03/17  3:20 PM  Result Value Ref Range Status   MRSA by PCR NEGATIVE NEGATIVE Final    Comment:        The GeneXpert MRSA Assay (FDA approved for NASAL specimens only), is one component of a comprehensive MRSA colonization surveillance program. It is not intended to diagnose MRSA infection nor to guide or monitor treatment for MRSA infections.   Urine Culture     Status: Abnormal (Preliminary result)   Collection Time: 10/03/17  4:56 PM  Result Value Ref Range Status   Specimen Description URINE, RANDOM  Final   Special Requests NONE  Final   Culture (A)  Final    30,000 COLONIES/mL PSEUDOMONAS AERUGINOSA 50,000 COLONIES/mL ENTEROCOCCUS FAECALIS    Report Status PENDING  Incomplete    Radiology Reports Dg Chest 2 View  Result Date: 10/05/2017 CLINICAL DATA:  Hypertension.  Shortness of breath. EXAM: CHEST  2 VIEW COMPARISON:  October 03, 2017 FINDINGS: There is rather subtle patchy airspace opacity in the right upper lobe, stable. There is mild scarring in each apex. There is a small left pleural effusion with slight bibasilar atelectasis. Lungs elsewhere are clear. Heart is upper normal in size with pulmonary vascularity within normal limits. There is aortic atherosclerosis. No adenopathy is evident by radiography. Bones are osteoporotic. There is degenerative change in each shoulder. IMPRESSION: Persistent subtle airspace opacity right upper lobe. There is apical scarring bilaterally. Small left pleural effusion noted with mild bibasilar atelectasis. Heart upper normal in size. There is aortic atherosclerosis. Bones osteoporotic. Aortic Atherosclerosis (ICD10-I70.0). Electronically Signed   By: Lowella Grip III M.D.   On: 10/05/2017 10:01   Dg Chest 2 View  Result Date: 10/03/2017 CLINICAL DATA:  Progressive cough for 1 week. Hypoxia. Shortness of breath. History of breast cancer, diabetes. EXAM: CHEST  2 VIEW COMPARISON:  Chest  radiograph August 14, 2015 FINDINGS: Cardiomediastinal silhouette is normal. Calcified aortic knob. Chronic interstitial changes. Faint airspace opacity RIGHT upper quadrant. Similar LEFT lung base pleural thickening and strandy densities. No pneumothorax. Osteopenia. IMPRESSION: RIGHT upper lobe pneumonia versus prominent RIGHT anterior first rib. Chronic interstitial changes. Followup PA and lateral chest X-ray is recommended in 3-4 weeks following trial of antibiotic therapy to ensure resolution and exclude underlying malignancy. LEFT lung base atelectasis/scarring. Aortic Atherosclerosis (ICD10-I70.0). Electronically Signed   By: Elon Alas M.D.   On: 10/03/2017 06:37   Ct Chest Wo Contrast  Result Date: 10/05/2017 CLINICAL DATA:  83 year old female with history of dyspnea. Additional history of breast cancer. EXAM: CT CHEST WITHOUT CONTRAST TECHNIQUE: Multidetector CT imaging of the chest was performed following the standard protocol without IV contrast. COMPARISON:  Chest CT 08/15/2015. FINDINGS: Cardiovascular: Heart size is normal. There is no significant pericardial fluid, thickening or pericardial calcification. There is aortic atherosclerosis, as well as atherosclerosis of the great vessels of the mediastinum and the coronary arteries, including calcified atherosclerotic plaque in the left main, left anterior descending, left circumflex and right coronary arteries. Severe calcifications of the aortic valve. Moderate calcifications of the mitral annulus. Mediastinum/Nodes: No pathologically  enlarged mediastinal or hilar lymph nodes. Esophagus is unremarkable in appearance. No axillary lymphadenopathy. Lungs/Pleura: In the medial aspect of the right lower lobe there is an apparent 2.3 x 1.4 cm nodule which encases several bronchi to the basal segments of the right lower lobe (axial image 90 of series 3) and is new compared to the prior study. Scattered areas of subsegmental atelectasis are  noted throughout the lung bases bilaterally. Trace amount of pleural fluid and/or thickening noted bilaterally. No confluent consolidative airspace disease. In the apex of the right upper lobe there is an new nodular area of ground-glass attenuation measuring 2.5 x 1.8 cm (axial image 17 of series 3). Upper Abdomen: 2.1 cm low-attenuation lesion in segment 7 of the liver is incompletely characterized on today's noncontrast CT examination, but stable compared to the prior study from 2016, presumably a cyst. Aortic atherosclerosis. Musculoskeletal: There are no aggressive appearing lytic or blastic lesions noted in the visualized portions of the skeleton. IMPRESSION: 1. New nodules in the right lung. The largest of these is a ground-glass attenuation nodule in the apex of the right upper lobe measuring 2.5 x 1.8 cm. However, there is also a solid-appearing nodule in the medial aspect of the right lower lobe (axial image 90 of series 3) measuring 2.3 x 1.4 cm. If there is clinical concern for malignancy at this time, further evaluation with PET-CT could be considered. Alternatively, consider one of the following in 3 months for both low-risk and high-risk individuals: (a) repeat chest CT or (b) follow-up PET-CT. This recommendation follows the consensus statement: Guidelines for Management of Incidental Pulmonary Nodules Detected on CT Images:From the Fleischner Society 2017; published online before print (10.1148/radiol.[id number]). 2. Trace amount of dependent pericardial fluid and/or thickening bilaterally. 3. Aortic atherosclerosis, in addition to left main and 3 vessel coronary artery disease. 4. There are calcifications of the aortic valve and mitral annulus. Echocardiographic correlation for evaluation of potential valvular dysfunction may be warranted if clinically indicated. Aortic Atherosclerosis (ICD10-I70.0). Electronically Signed   By: Vinnie Langton M.D.   On: 10/05/2017 10:19     CBC  Recent  Labs Lab 10/03/17 0544 10/04/17 0502  WBC 15.0* 14.1*  HGB 11.7* 10.4*  HCT 36.1 32.4*  PLT 205 163  MCV 93.2 93.3  MCH 30.3 30.1  MCHC 32.5 32.3  RDW 14.3 14.5  LYMPHSABS 5.5*  --   MONOABS 1.3*  --   EOSABS 0.5  --   BASOSABS 0.1  --     Chemistries   Recent Labs Lab 10/03/17 0544 10/04/17 0502  NA 137 137  K 4.6 4.4  CL 98* 99*  CO2 28 31  GLUCOSE 103* 95  BUN 31* 33*  CREATININE 1.53* 1.46*  CALCIUM 8.7* 8.4*   ------------------------------------------------------------------------------------------------------------------ estimated creatinine clearance is 23.8 mL/min (A) (by C-G formula based on SCr of 1.46 mg/dL (H)). ------------------------------------------------------------------------------------------------------------------ No results for input(s): HGBA1C in the last 72 hours. ------------------------------------------------------------------------------------------------------------------ No results for input(s): CHOL, HDL, LDLCALC, TRIG, CHOLHDL, LDLDIRECT in the last 72 hours. ------------------------------------------------------------------------------------------------------------------ No results for input(s): TSH, T4TOTAL, T3FREE, THYROIDAB in the last 72 hours.  Invalid input(s): FREET3 ------------------------------------------------------------------------------------------------------------------ No results for input(s): VITAMINB12, FOLATE, FERRITIN, TIBC, IRON, RETICCTPCT in the last 72 hours.  Coagulation profile No results for input(s): INR, PROTIME in the last 168 hours.  No results for input(s): DDIMER in the last 72 hours.  Cardiac Enzymes  Recent Labs Lab 10/03/17 0544 10/03/17 0950 10/04/17 0502  TROPONINI 0.08* 0.09* 0.11*   ------------------------------------------------------------------------------------------------------------------ Invalid input(s):  POCBNP    Assessment & Plan    1. Pneumonia  Changed to  oral Levaquin CT scan of the chest showed no significant abnormality   2. hypotension - ivf bolus given, now blood pressure normal  3. Hypothyroidism continue Synthroid   4. Systolic murmur patient was supposed to have a echo as outpatient  Echo shows no significant valvular abnormality  5. Essential hypertension patient currently not on any medications will monitor blood pressure  6.  Misc: lovenox for dvt proph       Code Status Orders        Start     Ordered   10/03/17 0953  Do not attempt resuscitation (DNR)  Continuous    Question Answer Comment  In the event of cardiac or respiratory ARREST Do not call a "code blue"   In the event of cardiac or respiratory ARREST Do not perform Intubation, CPR, defibrillation or ACLS   In the event of cardiac or respiratory ARREST Use medication by any route, position, wound care, and other measures to relive pain and suffering. May use oxygen, suction and manual treatment of airway obstruction as needed for comfort.      10/03/17 3810    Code Status History    Date Active Date Inactive Code Status Order ID Comments User Context   08/14/2015  8:41 AM 08/17/2015  6:14 PM DNR [id number]  Lance Coon, MD Inpatient    Advance Directive Documentation     Most Recent Value  Type of Advance Directive  Out of facility DNR (pink MOST or yellow form), Healthcare Power of Attorney  Pre-existing out of facility DNR order (yellow form or pink MOST form)  Yellow form placed in chart (order not valid for inpatient use)  "MOST" Form in Place?  -           Consults  none   DVT Prophylaxis  Lovenox   Lab Results  Component Value Date   PLT 163 10/04/2017     Time Spent in minutes 58min Greater than 50% of time spent in care coordination and counseling patient regarding the condition and plan of care.   Dustin Flock M.D on 10/05/2017 at 1:23 PM  Between 7am to 6pm - Pager - [phone]  After 6pm go to [email or web address] -  password [email or web address] [facility]   Office  [phone]

## 2017-10-05 NOTE — Clinical Social Work Placement (Signed)
   CLINICAL SOCIAL WORK PLACEMENT  NOTE  Date:  10/05/2017  Patient Details  Name: Kathleen Hampton MRN: [id number] Date of Birth: 13-May-1922  Clinical Social Work is seeking post-discharge placement for this patient at the [place] level of care (*CSW will initial, date and re-position this form in  chart as items are completed):  Yes   Patient/family provided with [place] Work Department's list of facilities offering this level of care within the geographic area requested by the patient (or if unable, by the patient's family).  Yes   Patient/family informed of their freedom to choose among providers that offer the needed level of care, that participate in Medicare, Medicaid or managed care program needed by the patient, have an available bed and are willing to accept the patient.  Yes   Patient/family informed of [facility]'s ownership interest in [facility] and [facility], as well as of the fact that they are under no obligation to receive care at these facilities.  PASRR submitted to EDS on       PASRR number received on       Existing PASRR number confirmed on 10/04/17     FL2 transmitted to all facilities in geographic area requested by pt/family on 10/04/17     FL2 transmitted to all facilities within larger geographic area on       Patient informed that his/her managed care company has contracts with or will negotiate with certain facilities, including the following:        Yes   Patient/family informed of bed offers received.  Patient chooses bed at  [facility] )     Physician recommends and patient chooses bed at      Patient to be transferred to   on  .  Patient to be transferred to facility by       Patient family notified on   of transfer.  Name of family member notified:        PHYSICIAN       Additional Comment:    _______________________________________________ Sekou Zuckerman, Veronia Beets, LCSW 10/05/2017,  3:19 PM

## 2017-10-05 NOTE — Progress Notes (Signed)
Physical Therapy Treatment Patient Details Name: Kathleen Hampton MRN: [id number] DOB: 07-Oct-1922 Today's Date: 10/05/2017    History of Present Illness Pt is a 83 y.o. F with PMH including R breast cancer, blindness, type 2 diabetes mellitus, HTN, HLD, hyperkalemia, UTI, and subclavian steal syndrome. Presented to ED on 10/03/17 with pneumonia, confirmed by imaging. Elevated troponin levels d/t demand ischemia as per previous notes.    PT Comments    Pt practiced sit to stand transfers using RW and progressed from max assist, to mod assist, to min assist, to CGA (with extra time to perform).  Pt appearing very anxious requiring encouragement and extra time for activity.  Able to march in place x3 reps with min to mod assist and use of RW before fatiguing and needing to sit down.  Will continue to focus on strengthening, balance, and progressive functional mobility per pt tolerance.    Follow Up Recommendations  SNF     Equipment Recommendations  Rolling walker with 5" wheels    Recommendations for Other Services       Precautions / Restrictions Precautions Precautions: Fall Precaution Comments: Blind; aspiration Restrictions Weight Bearing Restrictions: No    Mobility  Bed Mobility Overal bed mobility: Needs Assistance Bed Mobility: Supine to Sit;Sit to Supine     Supine to sit: Mod assist Sit to supine: Mod assist   General bed mobility comments: assist for trunk supine to sit and hand over hand cueing for use of bed rails; assist for trunk and B LE's sit to supine; 2 assist to boost pt up in bed  Transfers Overall transfer level: Needs assistance Equipment used: Rolling walker (2 wheeled) Transfers: Sit to/from Stand Sit to Stand: Max assist;Mod assist;Min assist;Min guard         General transfer comment: pt requiring vc's for UE and LE placement; max assist 1st trial standing; mod assist 2nd trial; min assist 3rd trial; CGA 4th trial (with extra vc's and time  to come to upright posture)  Ambulation/Gait Ambulation/Gait assistance: Min assist;Mod assist   Assistive device: Rolling walker (2 wheeled)   Gait velocity: decreased   General Gait Details: minimal foot clearance; able to march in place x3 reps B LE's; limited d/t fatigue   Stairs            Wheelchair Mobility    Modified Rankin (Stroke Patients Only)       Balance Overall balance assessment: Needs assistance Sitting-balance support: Bilateral upper extremity supported;Feet supported Sitting balance-Leahy Scale: Poor Sitting balance - Comments: static sitting balance   Standing balance support: Bilateral upper extremity supported Standing balance-Leahy Scale: Poor Standing balance comment: static standing balance with B UE support (initial posterior lean requiring vc's and assist to correct)                            Cognition Arousal/Alertness: Awake/alert Behavior During Therapy: Anxious Overall Cognitive Status: Within Functional Limits for tasks assessed                                        Exercises      General Comments   Nursing cleared pt for participation in physical therapy.  Pt agreeable to PT session.      Pertinent Vitals/Pain Pain Assessment: No/denies pain  Vitals (HR and O2) stable and WFL throughout treatment session.  Home Living                      Prior Function            PT Goals (current goals can now be found in the care plan section) Acute Rehab PT Goals Patient Stated Goal: to go home PT Goal Formulation: With patient Time For Goal Achievement: 10/18/17 Potential to Achieve Goals: Fair Progress towards PT goals: Progressing toward goals    Frequency    Min 2X/week      PT Plan Current plan remains appropriate    Co-evaluation              AM-PAC PT "6 Clicks" Daily Activity  Outcome Measure  Difficulty turning over in bed (including adjusting bedclothes,  sheets and blankets)?: A Lot Difficulty moving from lying on back to sitting on the side of the bed? : Unable Difficulty sitting down on and standing up from a chair with arms (e.g., wheelchair, bedside commode, etc,.)?: Unable Help needed moving to and from a bed to chair (including a wheelchair)?: A Lot Help needed walking in hospital room?: Total Help needed climbing 3-5 steps with a railing? : Total 6 Click Score: 8    End of Session Equipment Utilized During Treatment: Gait belt Activity Tolerance: Patient tolerated treatment well Patient left: in bed;with call bell/phone within reach;with nursing/sitter in room (Nursing tech present assisting pt with clean-up d/t incontinence in brief/bed) Nurse Communication: Mobility status;Precautions PT Visit Diagnosis: Other abnormalities of gait and mobility (R26.89);Muscle weakness (generalized) (M62.81)     Time: 5974-1638 PT Time Calculation (min) (ACUTE ONLY): 25 min  Charges:  $Therapeutic Activity: 23-37 mins                    G CodesLeitha Bleak, PT 10/05/17, 4:21 PM [phone]

## 2017-10-05 NOTE — Discharge Summary (Signed)
Kathleen Hampton at [facility], 83 y.o., DOB [birth year], MRN [id number]. Admission date: 10/03/2017 Discharge Date 10/05/2017 Primary MD System, Pcp Not In Admitting Physician Dustin Flock, MD  Admission Diagnosis  Bronchitis [J40] Healthcare-associated pneumonia [J18.9] Elevated troponin [R74.8]  Discharge Diagnosis   Active Problems:   PNA (pneumonia)   Pressure injury of skin  Chronic cough   Hypotensive   Hypothyroidism   History of hypertension   Lung nodule      Hospital Course  Kathleen Hampton  is a 83 y.o. female with a known history of blindness, breast cancer, diabetes type 2, essential hypertension, hyperlipidemia, history of subclavian steal syndrome who was sent from facility due to patient not feeling well. Patient had a cough and a chest x-ray was suggestive of pneumonia. Patient was admitted and treated with antibiotics. CT scan showed no evidence of pneumonia and this showed a lung nodule. Patient is very weak and deconditioned and will need to go to rehabilitation.            Consults  None  Significant Tests:  See full reports for all details     Dg Chest 2 View  Result Date: 10/05/2017 CLINICAL DATA:  Hypertension.  Shortness of breath. EXAM: CHEST  2 VIEW COMPARISON:  October 03, 2017 FINDINGS: There is rather subtle patchy airspace opacity in the right upper lobe, stable. There is mild scarring in each apex. There is a small left pleural effusion with slight bibasilar atelectasis. Lungs elsewhere are clear. Heart is upper normal in size with pulmonary vascularity within normal limits. There is aortic atherosclerosis. No adenopathy is evident by radiography. Bones are osteoporotic. There is degenerative change in each shoulder. IMPRESSION: Persistent subtle airspace opacity right upper lobe. There is apical scarring bilaterally. Small left pleural effusion noted with mild bibasilar atelectasis. Heart upper  normal in size. There is aortic atherosclerosis. Bones osteoporotic. Aortic Atherosclerosis (ICD10-I70.0). Electronically Signed   By: Lowella Grip III M.D.   On: 10/05/2017 10:01   Dg Chest 2 View  Result Date: 10/03/2017 CLINICAL DATA:  Progressive cough for 1 week. Hypoxia. Shortness of breath. History of breast cancer, diabetes. EXAM: CHEST  2 VIEW COMPARISON:  Chest radiograph August 14, 2015 FINDINGS: Cardiomediastinal silhouette is normal. Calcified aortic knob. Chronic interstitial changes. Faint airspace opacity RIGHT upper quadrant. Similar LEFT lung base pleural thickening and strandy densities. No pneumothorax. Osteopenia. IMPRESSION: RIGHT upper lobe pneumonia versus prominent RIGHT anterior first rib. Chronic interstitial changes. Followup PA and lateral chest X-ray is recommended in 3-4 weeks following trial of antibiotic therapy to ensure resolution and exclude underlying malignancy. LEFT lung base atelectasis/scarring. Aortic Atherosclerosis (ICD10-I70.0). Electronically Signed   By: Elon Alas M.D.   On: 10/03/2017 06:37   Ct Chest Wo Contrast  Result Date: 10/05/2017 CLINICAL DATA:  83 year old female with history of dyspnea. Additional history of breast cancer. EXAM: CT CHEST WITHOUT CONTRAST TECHNIQUE: Multidetector CT imaging of the chest was performed following the standard protocol without IV contrast. COMPARISON:  Chest CT 08/15/2015. FINDINGS: Cardiovascular: Heart size is normal. There is no significant pericardial fluid, thickening or pericardial calcification. There is aortic atherosclerosis, as well as atherosclerosis of the great vessels of the mediastinum and the coronary arteries, including calcified atherosclerotic plaque in the left main, left anterior descending, left circumflex and right coronary arteries. Severe calcifications of the aortic valve. Moderate calcifications of the mitral annulus. Mediastinum/Nodes: No pathologically enlarged mediastinal or  hilar lymph nodes. Esophagus is unremarkable  in appearance. No axillary lymphadenopathy. Lungs/Pleura: In the medial aspect of the right lower lobe there is an apparent 2.3 x 1.4 cm nodule which encases several bronchi to the basal segments of the right lower lobe (axial image 90 of series 3) and is new compared to the prior study. Scattered areas of subsegmental atelectasis are noted throughout the lung bases bilaterally. Trace amount of pleural fluid and/or thickening noted bilaterally. No confluent consolidative airspace disease. In the apex of the right upper lobe there is an new nodular area of ground-glass attenuation measuring 2.5 x 1.8 cm (axial image 17 of series 3). Upper Abdomen: 2.1 cm low-attenuation lesion in segment 7 of the liver is incompletely characterized on today's noncontrast CT examination, but stable compared to the prior study from 2016, presumably a cyst. Aortic atherosclerosis. Musculoskeletal: There are no aggressive appearing lytic or blastic lesions noted in the visualized portions of the skeleton. IMPRESSION: 1. New nodules in the right lung. The largest of these is a ground-glass attenuation nodule in the apex of the right upper lobe measuring 2.5 x 1.8 cm. However, there is also a solid-appearing nodule in the medial aspect of the right lower lobe (axial image 90 of series 3) measuring 2.3 x 1.4 cm. If there is clinical concern for malignancy at this time, further evaluation with PET-CT could be considered. Alternatively, consider one of the following in 3 months for both low-risk and high-risk individuals: (a) repeat chest CT or (b) follow-up PET-CT. This recommendation follows the consensus statement: Guidelines for Management of Incidental Pulmonary Nodules Detected on CT Images:From the Fleischner Society 2017; published online before print (10.1148/radiol.1610960454). 2. Trace amount of dependent pericardial fluid and/or thickening bilaterally. 3. Aortic atherosclerosis, in  addition to left main and 3 vessel coronary artery disease. 4. There are calcifications of the aortic valve and mitral annulus. Echocardiographic correlation for evaluation of potential valvular dysfunction may be warranted if clinically indicated. Aortic Atherosclerosis (ICD10-I70.0). Electronically Signed   By: Vinnie Langton M.D.   On: 10/05/2017 10:19       Today   Subjective:   Annell Greening  patient complains of cough and congestion  Objective:   Blood pressure (!) 126/42, pulse 82, temperature 98.1 F (36.7 C), temperature source Oral, resp. rate 15, height 5\' 5"  (1.651 m), weight 156 lb 8.4 oz (71 kg), SpO2 92 %.  .  Intake/Output Summary (Last 24 hours) at 10/05/17 1224 Last data filed at 10/05/17 1004  Gross per 24 hour  Intake              240 ml  Output                0 ml  Net              240 ml    Exam VITAL SIGNS: Blood pressure (!) 126/42, pulse 82, temperature 98.1 F (36.7 C), temperature source Oral, resp. rate 15, height 5\' 5"  (1.651 m), weight 156 lb 8.4 oz (71 kg), SpO2 92 %.  GENERAL:  83 y.o.-year-old patient lying in the bed with no acute distress.  EYES: Pupils equal, round, reactive to light and accommodation. No scleral icterus. Extraocular muscles intact.  HEENT: Head atraumatic, normocephalic. Oropharynx and nasopharynx clear.  NECK:  Supple, no jugular venous distention. No thyroid enlargement, no tenderness.  LUNGS: Normal breath sounds bilaterally, no wheezing, rales,rhonchi or crepitation. No use of accessory muscles of respiration.  CARDIOVASCULAR: S1, S2 normal. No murmurs, rubs, or gallops.  ABDOMEN: Soft,  nontender, nondistended. Bowel sounds present. No organomegaly or mass.  EXTREMITIES: No pedal edema, cyanosis, or clubbing.  NEUROLOGIC: Cranial nerves II through XII are intact. Muscle strength 5/5 in all extremities. Sensation intact. Gait not checked.  PSYCHIATRIC: The patient is alert and oriented x 3.  SKIN: No obvious rash,  lesion, or ulcer.   Data Review     CBC w Diff: Lab Results  Component Value Date   WBC 14.1 (H) 10/04/2017   HGB 10.4 (L) 10/04/2017   HGB 12.3 12/28/2011   HCT 32.4 (L) 10/04/2017   HCT 36.2 12/28/2011   PLT 163 10/04/2017   PLT 119 (L) 12/28/2011   LYMPHOPCT 37 10/03/2017   LYMPHOPCT 16.9 12/28/2011   MONOPCT 9 10/03/2017   MONOPCT 6.2 12/28/2011   EOSPCT 3 10/03/2017   EOSPCT 0.5 12/28/2011   BASOPCT 1 10/03/2017   BASOPCT 0.2 12/28/2011   CMP: Lab Results  Component Value Date   NA 137 10/04/2017   NA 137 12/19/2011   K 4.4 10/04/2017   K 4.6 12/19/2011   CL 99 (L) 10/04/2017   CL 103 12/19/2011   CO2 31 10/04/2017   CO2 26 12/19/2011   BUN 33 (H) 10/04/2017   BUN 33 (H) 12/19/2011   CREATININE 1.46 (H) 10/04/2017   CREATININE 0.99 12/19/2011   PROT 7.0 08/14/2015   ALBUMIN 3.6 08/14/2015   BILITOT 0.9 08/14/2015   ALKPHOS 63 08/14/2015   AST 34 08/14/2015   ALT 14 08/14/2015  .  Micro Results Recent Results (from the past 240 hour(s))  Blood Culture (routine x 2)     Status: None (Preliminary result)   Collection Time: 10/03/17  7:08 AM  Result Value Ref Range Status   Specimen Description BLOOD RT [facility]  Final   Special Requests   Final    BOTTLES DRAWN AEROBIC AND ANAEROBIC Blood Culture results may not be optimal due to an excessive volume of blood received in culture bottles   Culture NO GROWTH 2 DAYS  Final   Report Status PENDING  Incomplete  Blood Culture (routine x 2)     Status: None (Preliminary result)   Collection Time: 10/03/17  7:08 AM  Result Value Ref Range Status   Specimen Description BLOOD LT ARM  Final   Special Requests   Final    BOTTLES DRAWN AEROBIC AND ANAEROBIC Blood Culture adequate volume   Culture NO GROWTH 2 DAYS  Final   Report Status PENDING  Incomplete  MRSA PCR Screening     Status: None   Collection Time: 10/03/17  3:20 PM  Result Value Ref Range Status   MRSA by PCR NEGATIVE NEGATIVE Final    Comment:         The GeneXpert MRSA Assay (FDA approved for NASAL specimens only), is one component of a comprehensive MRSA colonization surveillance program. It is not intended to diagnose MRSA infection nor to guide or monitor treatment for MRSA infections.   Urine Culture     Status: Abnormal (Preliminary result)   Collection Time: 10/03/17  4:56 PM  Result Value Ref Range Status   Specimen Description URINE, RANDOM  Final   Special Requests NONE  Final   Culture (A)  Final    30,000 COLONIES/mL PSEUDOMONAS AERUGINOSA 50,000 COLONIES/mL ENTEROCOCCUS FAECALIS    Report Status PENDING  Incomplete        Code Status Orders        Start     Ordered   10/03/17 7106  Do not attempt resuscitation (DNR)  Continuous    Question Answer Comment  In the event of cardiac or respiratory ARREST Do not call a "code blue"   In the event of cardiac or respiratory ARREST Do not perform Intubation, CPR, defibrillation or ACLS   In the event of cardiac or respiratory ARREST Use medication by any route, position, wound care, and other measures to relive pain and suffering. May use oxygen, suction and manual treatment of airway obstruction as needed for comfort.      10/03/17 2641    Code Status History    Date Active Date Inactive Code Status Order ID Comments User Context   08/14/2015  8:41 AM 08/17/2015  6:14 PM DNR [id number]  Lance Coon, MD Inpatient    Advance Directive Documentation     Most Recent Value  Type of Advance Directive  Out of facility DNR (pink MOST or yellow form), Healthcare Power of Attorney  Pre-existing out of facility DNR order (yellow form or pink MOST form)  Yellow form placed in chart (order not valid for inpatient use)  "MOST" Form in Place?  -          Follow-up Information    Wilhelmina Mcardle, MD Follow up in 2 week(s).   Specialty:  Pulmonary Disease Why:  cough Contact information: [address]  [place] [address] [phone]            Discharge Medications   Allergies as of 10/05/2017      Reactions   Penicillins Anaphylaxis   Lodine [etodolac]       Medication List    TAKE these medications   aspirin 81 MG tablet Take 81 mg by mouth daily.   benzonatate 200 MG capsule Commonly known as:  TESSALON Take 1 capsule (200 mg total) by mouth 3 (three) times daily.   D3 HIGH POTENCY 1000 units capsule Generic drug:  Cholecalciferol Take 1,000 Units by mouth daily.   fluticasone 50 MCG/ACT nasal spray Commonly known as:  FLONASE Place 2 sprays into both nostrils daily.   furosemide 20 MG tablet Commonly known as:  LASIX Take 20 mg by mouth daily.   guaiFENesin 600 MG 12 hr tablet Commonly known as:  MUCINEX Take 1 tablet (600 mg total) by mouth 2 (two) times daily.   guaiFENesin-dextromethorphan 100-10 MG/5ML syrup Commonly known as:  ROBITUSSIN DM Take 5 mLs by mouth every 4 (four) hours as needed for cough.   ipratropium-albuterol 0.5-2.5 (3) MG/3ML Soln Commonly known as:  DUONEB Take 3 mLs by nebulization every 6 (six) hours as needed.   levofloxacin 250 MG tablet Commonly known as:  LEVAQUIN Take 1 tablet (250 mg total) by mouth daily.   levothyroxine 25 MCG tablet Commonly known as:  SYNTHROID, LEVOTHROID Take 25 mcg by mouth daily before breakfast.   pantoprazole 40 MG tablet Commonly known as:  PROTONIX Take 1 tablet (40 mg total) by mouth daily.   vitamin B-12 1000 MCG tablet Commonly known as:  CYANOCOBALAMIN Take 1,000 mcg by mouth daily.          Total Time in preparing paper work, data evaluation and todays exam - 35 minutes  Dustin Flock M.D on 10/05/2017 at 12:24 PM  [facility]   Office  [phone]

## 2017-10-06 LAB — CBC
HEMATOCRIT: 31.5 % — AB (ref 35.0–47.0)
HEMOGLOBIN: 10.3 g/dL — AB (ref 12.0–16.0)
MCH: 30.5 pg (ref 26.0–34.0)
MCHC: 32.7 g/dL (ref 32.0–36.0)
MCV: 93 fL (ref 80.0–100.0)
Platelets: 192 10*3/uL (ref 150–440)
RBC: 3.39 MIL/uL — ABNORMAL LOW (ref 3.80–5.20)
RDW: 13.8 % (ref 11.5–14.5)
WBC: 15.6 10*3/uL — AB (ref 3.6–11.0)

## 2017-10-06 LAB — URINE CULTURE

## 2017-10-06 MED ORDER — VANCOMYCIN HCL IN DEXTROSE 750-5 MG/150ML-% IV SOLN
750.0000 mg | INTRAVENOUS | Status: DC
  Filled 2017-10-06: qty 150

## 2017-10-06 MED ORDER — LEVOFLOXACIN 750 MG PO TABS: 750.0000 mg | ORAL_TABLET | ORAL | 0 refills | Status: AC

## 2017-10-06 NOTE — Plan of Care (Signed)
Problem: Education: Goal: Knowledge of [facility] Education information/materials will improve Outcome: Progressing VSS, free of falls during shift.  Denies pain.  Agitated at times overnight, reassured and redirected.  No complaints during shift.  Bed in low position, bed alarm on.  Call bell within reach, [place].

## 2017-10-06 NOTE — Progress Notes (Signed)
ANTIBIOTIC CONSULT NOTE - INITIAL  Pharmacy Consult for vancomycin Indication: UTI  Allergies  Allergen Reactions  . Penicillins Anaphylaxis  . Lodine [Etodolac]     Patient Measurements: Height: 5\' 5"  (165.1 cm) Weight: 156 lb 8.4 oz (71 kg) IBW/kg (Calculated) : 57 Adjusted Body Weight:   Vital Signs: Temp: 97.6 F (36.4 C) (10/27 0509) Temp Source: Oral (10/27 0509) BP: 123/40 (10/27 0509) Pulse Rate: 69 (10/27 0509) Intake/Output from previous day: 10/26 0701 - 10/27 0700 In: 243 [P.O.:240; I.V.:3] Out: -  Intake/Output from this shift: Total I/O In: 240 [P.O.:240] Out: -   Labs:  Recent Labs  10/04/17 0502  WBC 14.1*  HGB 10.4*  PLT 163  CREATININE 1.46*   Estimated Creatinine Clearance: 23.8 mL/min (A) (by C-G formula based on SCr of 1.46 mg/dL (H)). No results for input(s): VANCOTROUGH, VANCOPEAK, VANCORANDOM, GENTTROUGH, GENTPEAK, GENTRANDOM, TOBRATROUGH, TOBRAPEAK, TOBRARND, AMIKACINPEAK, AMIKACINTROU, AMIKACIN in the last 72 hours.   Microbiology: Recent Results (from the past 720 hour(s))  Blood Culture (routine x 2)     Status: None (Preliminary result)   Collection Time: 10/03/17  7:08 AM  Result Value Ref Range Status   Specimen Description BLOOD RT [facility]  Final   Special Requests   Final    BOTTLES DRAWN AEROBIC AND ANAEROBIC Blood Culture results may not be optimal due to an excessive volume of blood received in culture bottles   Culture NO GROWTH 3 DAYS  Final   Report Status PENDING  Incomplete  Blood Culture (routine x 2)     Status: None (Preliminary result)   Collection Time: 10/03/17  7:08 AM  Result Value Ref Range Status   Specimen Description BLOOD LT ARM  Final   Special Requests   Final    BOTTLES DRAWN AEROBIC AND ANAEROBIC Blood Culture adequate volume   Culture NO GROWTH 3 DAYS  Final   Report Status PENDING  Incomplete  MRSA PCR Screening     Status: None   Collection Time: 10/03/17  3:20 PM  Result Value Ref Range Status   MRSA by PCR NEGATIVE NEGATIVE Final    Comment:        The GeneXpert MRSA Assay (FDA approved for NASAL specimens only), is one component of a comprehensive MRSA colonization surveillance program. It is not intended to diagnose MRSA infection nor to guide or monitor treatment for MRSA infections.   Urine Culture     Status: Abnormal   Collection Time: 10/03/17  4:56 PM  Result Value Ref Range Status   Specimen Description URINE, RANDOM  Final   Special Requests NONE  Final   Culture (A)  Final    30,000 COLONIES/mL PSEUDOMONAS AERUGINOSA 50,000 COLONIES/mL ENTEROCOCCUS FAECALIS    Report Status 10/06/2017 FINAL  Final   Organism ID, Bacteria PSEUDOMONAS AERUGINOSA (A)  Final   Organism ID, Bacteria ENTEROCOCCUS FAECALIS (A)  Final      Susceptibility   Enterococcus faecalis - MIC*    AMPICILLIN <=2 SENSITIVE Sensitive     LEVOFLOXACIN >=8 RESISTANT Resistant     NITROFURANTOIN <=16 SENSITIVE Sensitive     VANCOMYCIN 1 SENSITIVE Sensitive     * 50,000 COLONIES/mL ENTEROCOCCUS FAECALIS   Pseudomonas aeruginosa - MIC*    CEFTAZIDIME INTERMEDIATE Intermediate     CIPROFLOXACIN <=0.25 SENSITIVE Sensitive     GENTAMICIN <=1 SENSITIVE Sensitive     IMIPENEM 2 SENSITIVE Sensitive     PIP/TAZO 16 SENSITIVE Sensitive     CEFEPIME 32 RESISTANT Resistant     *  30,000 COLONIES/mL PSEUDOMONAS AERUGINOSA    Medical History: Past Medical History:  Diagnosis Date  . Blind   . Cancer [facility])    breast, s/p mastectomy, no recurrence  . Colon polyps   . Diabetes mellitus without complication ([place])   . Hyperlipidemia   . Hypertension   . Subclavian steal syndrome     Medications:  Prescriptions Prior to Admission  Medication Sig Dispense Refill Last Dose  . aspirin 81 MG tablet Take 81 mg by mouth daily.   10/02/2017 at 0800  . Cholecalciferol (D3 HIGH POTENCY) 1000 units capsule Take 1,000 Units by mouth daily.   10/02/2017 at Unknown time  . furosemide (LASIX) 20 MG tablet Take  20 mg by mouth daily.   10/02/2017 at 0800  . levothyroxine (SYNTHROID, LEVOTHROID) 25 MCG tablet Take 25 mcg by mouth daily before breakfast.   10/02/2017 at 0800  . vitamin B-12 (CYANOCOBALAMIN) 1000 MCG tablet Take 1,000 mcg by mouth daily.   10/02/2017 at Unknown time  . [DISCONTINUED] levofloxacin (LEVAQUIN) 250 MG tablet Take 1 tablet (250 mg total) by mouth daily. (Patient not taking: Reported on 10/03/2017) 3 tablet 0 Completed Course at Unknown time   Scheduled:  . aspirin EC  81 mg Oral Daily  . benzonatate  200 mg Oral TID  . cholecalciferol  1,000 Units Oral Daily  . enoxaparin (LOVENOX) injection  30 mg Subcutaneous Q24H  . fluticasone  2 spray Each Nare Daily  . guaiFENesin  600 mg Oral BID  . ipratropium-albuterol  3 mL Nebulization TID  . levofloxacin  750 mg Oral Q48H  . levothyroxine  25 mcg Oral QAC breakfast  . methylPREDNISolone (SOLU-MEDROL) injection  60 mg Intravenous Q12H  . pantoprazole  40 mg Oral Daily  . sodium chloride flush  3 mL Intravenous Q12H  . vitamin B-12  1,000 mcg Oral Daily   Assessment: Pharmacy consulted to dose and monitor vancomycin in this 83 year old female  Urine culture: pseudomonas and enterococcus faecalis   Pseudomonas sensitive to levaquin enteroccocus resistant to levaquin  Goal of Therapy:  Vancomycin trough level 10-15 mcg/ml  Plan:  Will start vancomycin 750 mg IV q36 hours. Trough level to be drawn @ 2330 on 10/31.   Kathleen Hampton 10/06/2017,11:06 AM

## 2017-10-06 NOTE — Discharge Summary (Signed)
[place] at [facility], 83 y.o., DOB [birth year], MRN [id number]. Admission date: 10/03/2017 Discharge Date 10/06/2017 Primary MD System, Pcp Not In Admitting Physician Dustin Flock, MD  Admission Diagnosis  Bronchitis [J40] Healthcare-associated pneumonia [J18.9] Elevated troponin [R74.8]  Discharge Diagnosis   Active Problems:   PNA (pneumonia)   Pressure injury of skin  Chronic cough   Hypotensive   Hypothyroidism   History of hypertension   Lung nodule    Hospital Course  Britton Kirker  is a 83 y.o. female with a known history of blindness, breast cancer, diabetes type 2, essential hypertension, hyperlipidemia, history of subclavian steal syndrome who was sent from facility due to patient not feeling well. Patient had a cough and a chest x-ray was suggestive of pneumonia. Patient was admitted and treated with antibiotics. CT scan showed no evidence of pneumonia and this showed a lung nodule. Patient is very weak and deconditioned and will need to go to rehabilitation.  Also had UTI and Ur cx grew Pseudomonas and E fecalis- Sensitivities checked. Pseudo is sensi to levaquin. E fecalis is not, but pt is symptomatically getting  Better and Only 50,000 CFU of E fecalis were found. So I choose not to treat for that now. May be contamination. WBcs stays high, but pt is on steroids.   Consults  None  Significant Tests:  See full reports for all details     Dg Chest 2 View  Result Date: 10/05/2017 CLINICAL DATA:  Hypertension.  Shortness of breath. EXAM: CHEST  2 VIEW COMPARISON:  October 03, 2017 FINDINGS: There is rather subtle patchy airspace opacity in the right upper lobe, stable. There is mild scarring in each apex. There is a small left pleural effusion with slight bibasilar atelectasis. Lungs elsewhere are clear. Heart is upper normal in size with pulmonary vascularity within normal limits. There is aortic atherosclerosis. No  adenopathy is evident by radiography. Bones are osteoporotic. There is degenerative change in each shoulder. IMPRESSION: Persistent subtle airspace opacity right upper lobe. There is apical scarring bilaterally. Small left pleural effusion noted with mild bibasilar atelectasis. Heart upper normal in size. There is aortic atherosclerosis. Bones osteoporotic. Aortic Atherosclerosis (ICD10-I70.0). Electronically Signed   By: Lowella Grip III M.D.   On: 10/05/2017 10:01   Dg Chest 2 View  Result Date: 10/03/2017 CLINICAL DATA:  Progressive cough for 1 week. Hypoxia. Shortness of breath. History of breast cancer, diabetes. EXAM: CHEST  2 VIEW COMPARISON:  Chest radiograph August 14, 2015 FINDINGS: Cardiomediastinal silhouette is normal. Calcified aortic knob. Chronic interstitial changes. Faint airspace opacity RIGHT upper quadrant. Similar LEFT lung base pleural thickening and strandy densities. No pneumothorax. Osteopenia. IMPRESSION: RIGHT upper lobe pneumonia versus prominent RIGHT anterior first rib. Chronic interstitial changes. Followup PA and lateral chest X-ray is recommended in 3-4 weeks following trial of antibiotic therapy to ensure resolution and exclude underlying malignancy. LEFT lung base atelectasis/scarring. Aortic Atherosclerosis (ICD10-I70.0). Electronically Signed   By: Elon Alas M.D.   On: 10/03/2017 06:37   Ct Chest Wo Contrast  Result Date: 10/05/2017 CLINICAL DATA:  83 year old female with history of dyspnea. Additional history of breast cancer. EXAM: CT CHEST WITHOUT CONTRAST TECHNIQUE: Multidetector CT imaging of the chest was performed following the standard protocol without IV contrast. COMPARISON:  Chest CT 08/15/2015. FINDINGS: Cardiovascular: Heart size is normal. There is no significant pericardial fluid, thickening or pericardial calcification. There is aortic atherosclerosis, as well as atherosclerosis of the great vessels of  the mediastinum and the coronary  arteries, including calcified atherosclerotic plaque in the left main, left anterior descending, left circumflex and right coronary arteries. Severe calcifications of the aortic valve. Moderate calcifications of the mitral annulus. Mediastinum/Nodes: No pathologically enlarged mediastinal or hilar lymph nodes. Esophagus is unremarkable in appearance. No axillary lymphadenopathy. Lungs/Pleura: In the medial aspect of the right lower lobe there is an apparent 2.3 x 1.4 cm nodule which encases several bronchi to the basal segments of the right lower lobe (axial image 90 of series 3) and is new compared to the prior study. Scattered areas of subsegmental atelectasis are noted throughout the lung bases bilaterally. Trace amount of pleural fluid and/or thickening noted bilaterally. No confluent consolidative airspace disease. In the apex of the right upper lobe there is an new nodular area of ground-glass attenuation measuring 2.5 x 1.8 cm (axial image 17 of series 3). Upper Abdomen: 2.1 cm low-attenuation lesion in segment 7 of the liver is incompletely characterized on today's noncontrast CT examination, but stable compared to the prior study from 2016, presumably a cyst. Aortic atherosclerosis. Musculoskeletal: There are no aggressive appearing lytic or blastic lesions noted in the visualized portions of the skeleton. IMPRESSION: 1. New nodules in the right lung. The largest of these is a ground-glass attenuation nodule in the apex of the right upper lobe measuring 2.5 x 1.8 cm. However, there is also a solid-appearing nodule in the medial aspect of the right lower lobe (axial image 90 of series 3) measuring 2.3 x 1.4 cm. If there is clinical concern for malignancy at this time, further evaluation with PET-CT could be considered. Alternatively, consider one of the following in 3 months for both low-risk and high-risk individuals: (a) repeat chest CT or (b) follow-up PET-CT. This recommendation follows the consensus  statement: Guidelines for Management of Incidental Pulmonary Nodules Detected on CT Images:From the Fleischner Society 2017; published online before print (10.1148/radiol.9323557322). 2. Trace amount of dependent pericardial fluid and/or thickening bilaterally. 3. Aortic atherosclerosis, in addition to left main and 3 vessel coronary artery disease. 4. There are calcifications of the aortic valve and mitral annulus. Echocardiographic correlation for evaluation of potential valvular dysfunction may be warranted if clinically indicated. Aortic Atherosclerosis (ICD10-I70.0). Electronically Signed   By: Vinnie Langton M.D.   On: 10/05/2017 10:19       Today   Subjective:   Kathleen Hampton  patient complains of cough and congestion  Objective:   Blood pressure (!) 129/47, pulse 92, temperature 98 F (36.7 C), temperature source Oral, resp. rate 18, height 5\' 5"  (1.651 m), weight 71 kg (156 lb 8.4 oz), SpO2 99 %.  .  Intake/Output Summary (Last 24 hours) at 10/06/17 1341 Last data filed at 10/06/17 1045  Gross per 24 hour  Intake              243 ml  Output                0 ml  Net              243 ml    Exam VITAL SIGNS: Blood pressure (!) 129/47, pulse 92, temperature 98 F (36.7 C), temperature source Oral, resp. rate 18, height 5\' 5"  (1.651 m), weight 71 kg (156 lb 8.4 oz), SpO2 99 %.  GENERAL:  83 y.o.-year-old patient lying in the bed with no acute distress.  EYES: Pupils equal, round, reactive to light and accommodation. No scleral icterus. Extraocular muscles intact.  HEENT: Head atraumatic, normocephalic.  Oropharynx and nasopharynx clear.  NECK:  Supple, no jugular venous distention. No thyroid enlargement, no tenderness.  LUNGS: Normal breath sounds bilaterally, no wheezing, rales,rhonchi or crepitation. No use of accessory muscles of respiration.  CARDIOVASCULAR: S1, S2 normal. No murmurs, rubs, or gallops.  ABDOMEN: Soft, nontender, nondistended. Bowel sounds present. No  organomegaly or mass.  EXTREMITIES: No pedal edema, cyanosis, or clubbing.  NEUROLOGIC: Cranial nerves II through XII are intact. Muscle strength 3-4/5 in all extremities. Sensation intact. Gait not checked.  PSYCHIATRIC: The patient is alert and oriented x 3.  SKIN: No obvious rash, lesion, or ulcer.   Data Review     CBC w Diff:  Lab Results  Component Value Date   WBC 15.6 (H) 10/06/2017   HGB 10.3 (L) 10/06/2017   HGB 12.3 12/28/2011   HCT 31.5 (L) 10/06/2017   HCT 36.2 12/28/2011   PLT 192 10/06/2017   PLT 119 (L) 12/28/2011   LYMPHOPCT 37 10/03/2017   LYMPHOPCT 16.9 12/28/2011   MONOPCT 9 10/03/2017   MONOPCT 6.2 12/28/2011   EOSPCT 3 10/03/2017   EOSPCT 0.5 12/28/2011   BASOPCT 1 10/03/2017   BASOPCT 0.2 12/28/2011   CMP:  Lab Results  Component Value Date   NA 137 10/04/2017   NA 137 12/19/2011   K 4.4 10/04/2017   K 4.6 12/19/2011   CL 99 (L) 10/04/2017   CL 103 12/19/2011   CO2 31 10/04/2017   CO2 26 12/19/2011   BUN 33 (H) 10/04/2017   BUN 33 (H) 12/19/2011   CREATININE 1.46 (H) 10/04/2017   CREATININE 0.99 12/19/2011   PROT 7.0 08/14/2015   ALBUMIN 3.6 08/14/2015   BILITOT 0.9 08/14/2015   ALKPHOS 63 08/14/2015   AST 34 08/14/2015   ALT 14 08/14/2015  .  Micro Results Recent Results (from the past 240 hour(s))  Blood Culture (routine x 2)     Status: None (Preliminary result)   Collection Time: 10/03/17  7:08 AM  Result Value Ref Range Status   Specimen Description BLOOD RT [facility]  Final   Special Requests   Final    BOTTLES DRAWN AEROBIC AND ANAEROBIC Blood Culture results may not be optimal due to an excessive volume of blood received in culture bottles   Culture NO GROWTH 3 DAYS  Final   Report Status PENDING  Incomplete  Blood Culture (routine x 2)     Status: None (Preliminary result)   Collection Time: 10/03/17  7:08 AM  Result Value Ref Range Status   Specimen Description BLOOD LT ARM  Final   Special Requests   Final    BOTTLES DRAWN  AEROBIC AND ANAEROBIC Blood Culture adequate volume   Culture NO GROWTH 3 DAYS  Final   Report Status PENDING  Incomplete  MRSA PCR Screening     Status: None   Collection Time: 10/03/17  3:20 PM  Result Value Ref Range Status   MRSA by PCR NEGATIVE NEGATIVE Final    Comment:        The GeneXpert MRSA Assay (FDA approved for NASAL specimens only), is one component of a comprehensive MRSA colonization surveillance program. It is not intended to diagnose MRSA infection nor to guide or monitor treatment for MRSA infections.   Urine Culture     Status: Abnormal   Collection Time: 10/03/17  4:56 PM  Result Value Ref Range Status   Specimen Description URINE, RANDOM  Final   Special Requests NONE  Final   Culture (A)  Final    30,000 COLONIES/mL PSEUDOMONAS AERUGINOSA 50,000 COLONIES/mL ENTEROCOCCUS FAECALIS    Report Status 10/06/2017 FINAL  Final   Organism ID, Bacteria PSEUDOMONAS AERUGINOSA (A)  Final   Organism ID, Bacteria ENTEROCOCCUS FAECALIS (A)  Final      Susceptibility   Enterococcus faecalis - MIC*    AMPICILLIN <=2 SENSITIVE Sensitive     LEVOFLOXACIN >=8 RESISTANT Resistant     NITROFURANTOIN <=16 SENSITIVE Sensitive     VANCOMYCIN 1 SENSITIVE Sensitive     * 50,000 COLONIES/mL ENTEROCOCCUS FAECALIS   Pseudomonas aeruginosa - MIC*    CEFTAZIDIME INTERMEDIATE Intermediate     CIPROFLOXACIN <=0.25 SENSITIVE Sensitive     GENTAMICIN <=1 SENSITIVE Sensitive     IMIPENEM 2 SENSITIVE Sensitive     PIP/TAZO 16 SENSITIVE Sensitive     CEFEPIME 32 RESISTANT Resistant     * 30,000 COLONIES/mL PSEUDOMONAS AERUGINOSA        Code Status Orders        Start     Ordered   10/03/17 0953  Do not attempt resuscitation (DNR)  Continuous    Question Answer Comment  In the event of cardiac or respiratory ARREST Do not call a "code blue"   In the event of cardiac or respiratory ARREST Do not perform Intubation, CPR, defibrillation or ACLS   In the event of cardiac or  respiratory ARREST Use medication by any route, position, wound care, and other measures to relive pain and suffering. May use oxygen, suction and manual treatment of airway obstruction as needed for comfort.      10/03/17 5176    Code Status History    Date Active Date Inactive Code Status Order ID Comments User Context   08/14/2015  8:41 AM 08/17/2015  6:14 PM DNR [id number]  Lance Coon, MD Inpatient    Advance Directive Documentation     Most Recent Value  Type of Advance Directive  Out of facility DNR (pink MOST or yellow form), Healthcare Power of Attorney  Pre-existing out of facility DNR order (yellow form or pink MOST form)  Yellow form placed in chart (order not valid for inpatient use)  "MOST" Form in Place?  -           Contact information for follow-up providers    Wilhelmina Mcardle, MD Follow up in 2 week(s).   Specialty:  Pulmonary Disease Why:  cough Contact information: [place] [address] [phone]            Contact information for after-discharge care    Destination    [facility] .   Specialty:  [place] information: [address] [place] [phone]                  Discharge Medications   Allergies as of 10/06/2017      Reactions   Penicillins Anaphylaxis   Lodine [etodolac]       Medication List    TAKE these medications   aspirin 81 MG tablet Take 81 mg by mouth daily.   benzonatate 200 MG capsule Commonly known as:  TESSALON Take 1 capsule (200 mg total) by mouth 3 (three) times daily.   D3 HIGH POTENCY 1000 units capsule Generic drug:  Cholecalciferol Take 1,000 Units by mouth daily.   fluticasone 50 MCG/ACT nasal spray Commonly known as:  FLONASE Place 2 sprays into both nostrils daily.   furosemide 20 MG tablet  Commonly known as:  LASIX Take 20 mg by mouth daily.   guaiFENesin 600 MG 12 hr tablet Commonly known  as:  MUCINEX Take 1 tablet (600 mg total) by mouth 2 (two) times daily.   guaiFENesin-dextromethorphan 100-10 MG/5ML syrup Commonly known as:  ROBITUSSIN DM Take 5 mLs by mouth every 4 (four) hours as needed for cough.   ipratropium-albuterol 0.5-2.5 (3) MG/3ML Soln Commonly known as:  DUONEB Take 3 mLs by nebulization every 6 (six) hours as needed.   levofloxacin 750 MG tablet Commonly known as:  LEVAQUIN Take 1 tablet (750 mg total) by mouth every other day. What changed:  medication strength  how much to take  when to take this   levothyroxine 25 MCG tablet Commonly known as:  SYNTHROID, LEVOTHROID Take 25 mcg by mouth daily before breakfast.   pantoprazole 40 MG tablet Commonly known as:  PROTONIX Take 1 tablet (40 mg total) by mouth daily.   vitamin B-12 1000 MCG tablet Commonly known as:  CYANOCOBALAMIN Take 1,000 mcg by mouth daily.          Total Time in preparing paper work, data evaluation and todays exam - 35 minutes  Vaughan Basta M.D on 10/06/2017 at 1:41 PM  [facility]   Office  [phone]

## 2017-10-06 NOTE — Progress Notes (Signed)
LCSW completed Med Necessities form, envelope with face sheet and other patient documents. Patient will be transported to [facility] Call Report Number [id number] Verita Lamb and let her know [facility] uploaded discharge summaries from Dr Anselm Jungling.  [employer] LCSW [phone]

## 2017-10-06 NOTE — Progress Notes (Signed)
Report called to [facility] at [facility] [phone]). Pt going to room 203B. Pt to go by EMS. Pt and pt son, [name] updated on discharge plan. Will call EMS shortly.

## 2017-10-06 NOTE — Progress Notes (Signed)
Pt in transfer pack and ready for discharge. EMS called for transport.

## 2017-10-08 ENCOUNTER — Telehealth: Payer: Self-pay | Admitting: Pulmonary Disease

## 2017-10-08 LAB — CULTURE, BLOOD (ROUTINE X 2)
CULTURE: NO GROWTH
Culture: NO GROWTH
SPECIAL REQUESTS: ADEQUATE

## 2017-10-08 NOTE — Telephone Encounter (Signed)
This is fine 

## 2017-10-08 NOTE — Telephone Encounter (Signed)
PT caregiver called to schedule HFU appt Needs 2 weeks Scheduled with [facility] on 11/16 This is a few days over the 2 week timeframe Should appt be kept or moved to sooner Please advise

## 2017-10-11 ENCOUNTER — Encounter
Admission: RE | Admit: 2017-10-11 | Discharge: 2017-10-11 | Disposition: A | Discharge status: Home / Self Care | Payer: Medicare Other | Source: Ambulatory Visit | Attending: Internal Medicine | Admitting: Internal Medicine

## 2017-10-16 ENCOUNTER — Non-Acute Institutional Stay (SKILLED_NURSING_FACILITY): Payer: Medicare Other | Admitting: Gerontology

## 2017-10-16 DIAGNOSIS — E46 Unspecified protein-calorie malnutrition: Secondary | ICD-10-CM

## 2017-10-16 DIAGNOSIS — L89320 Pressure ulcer of left buttock, unstageable: Secondary | ICD-10-CM

## 2017-10-17 ENCOUNTER — Other Ambulatory Visit
Admission: RE | Admit: 2017-10-17 | Discharge: 2017-10-17 | Disposition: A | Discharge status: Home / Self Care | Payer: No Typology Code available for payment source | Source: Ambulatory Visit | Attending: Gerontology | Admitting: Gerontology

## 2017-10-17 ENCOUNTER — Encounter: Payer: Self-pay | Admitting: Gerontology

## 2017-10-17 DIAGNOSIS — S3091XA Unspecified superficial injury of lower back and pelvis, initial encounter: Secondary | ICD-10-CM | POA: Insufficient documentation

## 2017-10-17 DIAGNOSIS — B9562 Methicillin resistant Staphylococcus aureus infection as the cause of diseases classified elsewhere: Secondary | ICD-10-CM | POA: Insufficient documentation

## 2017-10-17 DIAGNOSIS — Z1620 Resistance to unspecified antibiotic: Secondary | ICD-10-CM | POA: Insufficient documentation

## 2017-10-17 DIAGNOSIS — X58XXXA Exposure to other specified factors, initial encounter: Secondary | ICD-10-CM | POA: Insufficient documentation

## 2017-10-17 NOTE — Progress Notes (Signed)
Location:   [facility] Room Number: 203B Place of Service:  SNF [phone]) Provider:  Toni Arthurs, NP-C  System, Pcp Not In  Patient Care Team: System, Pcp Not In as PCP - General  Extended Emergency Contact Information Primary Emergency Contact: Baka,David L Address: [address]          [place], [place] [address] Johnnette Litter of [place] Phone: [phone] Relation: Son Secondary Emergency Contact: Minna Merritts [place] of [place] Phone: [phone] Relation: Son  Code Status:  DNR Goals of care: Advanced Directive information Advanced Directives 10/16/2017  Does Patient Have a Medical Advance Directive? Yes  Type of Advance Directive Out of facility DNR (pink MOST or yellow form)  Does patient want to make changes to medical advance directive? No - Patient declined  Copy of [place] in Chart? -  Pre-existing out of facility DNR order (yellow form or pink MOST form) -     Chief Complaint  Patient presents with  . Acute Visit    Recheck wound    HPI:  Pt is a 83 y.o. female seen today for an acute visit for evaluation of unstageable pressure wound to the left ischium.  Patient's son was present at the time of evaluation.  He reports the wound has been there for "months."  He says it had been treated at her previous facility with topical antibiotic ointment with no improvement.  The wound is approximately 3.5 cm in diameter, covered with a thick, tan film coating.  If the wound is palpated, thick, purulent, brown, malodorous drainage is excreted.  After obtaining verbal consent from the patient and her son, I performed sharp debridement of the eschar and underlying tissues.  The wound had several areas of deep tunneling and undermining along with several other satellite pockets of pus and purulent drainage.  At completion of debridement, wound base was red, beefy tissue with some bleeding.  Wound was then  irrigated with normal saline.  4 x 4 gauze pad soaked in Dakin's solution was packed into the wound, then covered with an Allevyn dressing.  Patient tolerated procedure very well.  She was awake/alert and oriented, conversant, throughout the procedure.  Patient and son verbalized appreciation for the debridement.  Discussed plans for for future wound management with the son.  The son also called his brother and I spoke with him via telephone.  I updated him on the plan of action, receiving verbal consent from him as well.  I will update the facility wound care nurse, for her to follow for dressing changes, etc. Patient denies pain at completion.  Vital signs stable.  No other complaints.   Past Medical History:  Diagnosis Date  . Acquired hypothyroidism 09/28/2017   unspecified  . Acute on chronic systolic CHF (congestive heart failure), NYHA class 4 ([place]) 09/28/2017  . Benign essential hypertension 09/28/2017  . Blind   . Cancer [facility])    breast, s/p mastectomy, no recurrence  . Colon polyps   . Diabetes mellitus without complication ([place])   . DM type 2 with diabetic mixed hyperlipidemia ([place]) 09/28/2017  . Hyperlipidemia   . Hypertension   . Mixed hyperlipidemia 09/28/2017  . Primary osteoarthritis of both knees 09/28/2017   post bilateral knee replacements  . Subclavian steal syndrome    Past Surgical History:  Procedure Laterality Date  . ABDOMINAL HYSTERECTOMY    . MASTECTOMY Right   . REPLACEMENT TOTAL KNEE BILATERAL    .  TOTAL HIP ARTHROPLASTY Left 2000    Allergies  Allergen Reactions  . Etodolac      Other Reaction: vaginal knots  . Penicillins Anaphylaxis and Rash    Allergies as of 10/16/2017      Reactions   Penicillins Anaphylaxis   Lodine [etodolac]       Medication List        Accurate as of 10/16/17 11:59 PM. Always use your most recent med list.          aspirin 81 MG chewable tablet Chew 81 mg daily by mouth.   benzonatate 200 MG capsule Commonly  known as:  TESSALON Take 1 capsule (200 mg total) by mouth 3 (three) times daily.   bisacodyl 10 MG suppository Commonly known as:  DULCOLAX Place 10 mg rectally daily as needed.   clindamycin 300 MG capsule Commonly known as:  CLEOCIN Take 300 mg every 8 (eight) hours by mouth.   D3 HIGH POTENCY 1000 units capsule Generic drug:  Cholecalciferol Take 1,000 Units by mouth daily.   ENSURE ENLIVE PO Take 1 Bottle 2 (two) times daily between meals by mouth.   feeding supplement (PRO-STAT SUGAR FREE 64) Liqd Take 30 mLs 2 (two) times daily between meals by mouth.   fluticasone 50 MCG/ACT nasal spray Commonly known as:  FLONASE Place 2 sprays into both nostrils daily.   furosemide 20 MG tablet Commonly known as:  LASIX Take 20 mg by mouth daily.   guaiFENesin 600 MG 12 hr tablet Commonly known as:  MUCINEX Take 1 tablet (600 mg total) by mouth 2 (two) times daily.   guaiFENesin-dextromethorphan 100-10 MG/5ML syrup Commonly known as:  ROBITUSSIN DM Take 5 mLs by mouth every 4 (four) hours as needed for cough.   ipratropium-albuterol 0.5-2.5 (3) MG/3ML Soln Commonly known as:  DUONEB Take 3 mLs by nebulization every 6 (six) hours as needed.   levothyroxine 25 MCG tablet Commonly known as:  SYNTHROID, LEVOTHROID Take 25 mcg by mouth daily before breakfast.   magnesium hydroxide 400 MG/5ML suspension Commonly known as:  MILK OF MAGNESIA Take 30 mLs every 4 (four) hours as needed by mouth for mild constipation. Constipation/ no BM for 2 days   mirtazapine 7.5 MG tablet Commonly known as:  REMERON Take 7.5 mg at bedtime by mouth.   pantoprazole 40 MG tablet Commonly known as:  PROTONIX Take 1 tablet (40 mg total) by mouth daily.   RISA-BID PROBIOTIC Tabs Take 1 tablet 2 (two) times daily by mouth.   vitamin B-12 1000 MCG tablet Commonly known as:  CYANOCOBALAMIN Take 1,000 mcg by mouth daily.       Review of Systems  Constitutional: Negative for activity  change, appetite change, chills, diaphoresis and fever.  HENT: Negative for congestion, sneezing, sore throat, trouble swallowing and voice change.   Respiratory: Negative for apnea, cough, choking, chest tightness, shortness of breath and wheezing.   Cardiovascular: Negative for chest pain, palpitations and leg swelling.  Gastrointestinal: Negative for abdominal distention, abdominal pain, constipation, diarrhea and nausea.  Genitourinary: Negative for difficulty urinating, dysuria, frequency and urgency.  Musculoskeletal: Negative for back pain, gait problem and myalgias. Arthralgias: typical arthritis.  Skin: Positive for wound. Negative for color change, pallor and rash.  Neurological: Negative for dizziness, tremors, syncope, speech difficulty, weakness, numbness and headaches.  Psychiatric/Behavioral: Negative for agitation and behavioral problems.  All other systems reviewed and are negative.   Immunization History  Administered Date(s) Administered  . Influenza-Unspecified 09/12/2012, 08/28/2013, 08/28/2014, 09/13/2015,  09/09/2016  . Pneumococcal Conjugate-13 11/21/2014   Pertinent  Health Maintenance Due  Topic Date Due  . FOOT EXAM  02/15/1932  . OPHTHALMOLOGY EXAM  02[birth year]15/1932  . URINE MICROALBUMIN  02/15/1934  . DEXA SCAN  02/15/1989  . PNA vac Low Risk Adult (2 of 2 - PPSV23) 11/22/2015  . HEMOGLOBIN A1C  02/11/2016  . INFLUENZA VACCINE  07/11/2017   No flowsheet data found. Functional Status Survey:    Vitals:   10/16/17 0958  BP: 136/62  Pulse: 73  Resp: 18  Temp: 97.7 F (36.5 C)  SpO2: 94%  Weight: 180 lb 8 oz (81.9 kg)  Height: 5\' 5"  (1.651 m)   Body mass index is 30.04 kg/m. Physical Exam  Constitutional: She is oriented to person, place, and time. Vital signs are normal. She appears well-developed and well-nourished. She is active and cooperative. She does not appear ill. No distress.  HENT:  Head: Normocephalic and atraumatic.  Mouth/Throat: Uvula  is midline, oropharynx is clear and moist and mucous membranes are normal. Mucous membranes are not pale, not dry and not cyanotic.  Eyes: Conjunctivae, EOM and lids are normal. Pupils are equal, round, and reactive to light.  Neck: Trachea normal, normal range of motion and full passive range of motion without pain. Neck supple. No JVD present. No tracheal deviation, no edema and no erythema present. No thyromegaly present.  Cardiovascular: Normal rate, normal heart sounds, intact distal pulses and normal pulses. An irregular rhythm present. Exam reveals no gallop, no distant heart sounds and no friction rub.  No murmur heard. Pulses:      Dorsalis pedis pulses are 2+ on the right side, and 2+ on the left side.  No edema  Pulmonary/Chest: Effort normal. No accessory muscle usage. No respiratory distress. She has no decreased breath sounds. She has no wheezes. She has rhonchi (faint) in the right lower field and the left lower field. She has no rales. She exhibits no tenderness.  Abdominal: Soft. Normal appearance and bowel sounds are normal. She exhibits no distension and no ascites. There is no tenderness.  Musculoskeletal: Normal range of motion. She exhibits no edema or tenderness.  Expected osteoarthritis, stiffness; generalized weakness   Neurological: She is alert and oriented to person, place, and time. She has normal strength.  Skin: Skin is warm, dry and intact. She is not diaphoretic. No cyanosis. No pallor. Nails show no clubbing.     Psychiatric: She has a normal mood and affect. Her speech is normal and behavior is normal. Judgment and thought content normal. Cognition and memory are normal.  Nursing note and vitals reviewed.   Labs reviewed: Recent Labs    10/03/17 0544 10/04/17 0502  NA 137 137  K 4.6 4.4  CL 98* 99*  CO2 28 31  GLUCOSE 103* 95  BUN 31* 33*  CREATININE 1.53* 1.46*  CALCIUM 8.7* 8.4*   No results for input(s): AST, ALT, ALKPHOS, BILITOT, PROT, ALBUMIN  in the last 8760 hours. Recent Labs    10/03/17 0544 10/04/17 0502 10/06/17 1315  WBC 15.0* 14.1* 15.6*  NEUTROABS 7.6*  --   --   HGB 11.7* 10.4* 10.3*  HCT 36.1 32.4* 31.5*  MCV 93.2 93.3 93.0  PLT 205 163 192   No results found for: TSH Lab Results  Component Value Date   HGBA1C 5.9 08/14/2015   No results found for: CHOL, HDL, LDLCALC, LDLDIRECT, TRIG, CHOLHDL  Significant Diagnostic Results in last 30 days:  Dg Chest 2  View  Result Date: 10/05/2017 CLINICAL DATA:  Hypertension.  Shortness of breath. EXAM: CHEST  2 VIEW COMPARISON:  October 03, 2017 FINDINGS: There is rather subtle patchy airspace opacity in the right upper lobe, stable. There is mild scarring in each apex. There is a small left pleural effusion with slight bibasilar atelectasis. Lungs elsewhere are clear. Heart is upper normal in size with pulmonary vascularity within normal limits. There is aortic atherosclerosis. No adenopathy is evident by radiography. Bones are osteoporotic. There is degenerative change in each shoulder. IMPRESSION: Persistent subtle airspace opacity right upper lobe. There is apical scarring bilaterally. Small left pleural effusion noted with mild bibasilar atelectasis. Heart upper normal in size. There is aortic atherosclerosis. Bones osteoporotic. Aortic Atherosclerosis (ICD10-I70.0). Electronically Signed   By: Lowella Grip III M.D.   On: 10/05/2017 10:01   Dg Chest 2 View  Result Date: 10/03/2017 CLINICAL DATA:  Progressive cough for 1 week. Hypoxia. Shortness of breath. History of breast cancer, diabetes. EXAM: CHEST  2 VIEW COMPARISON:  Chest radiograph August 14, 2015 FINDINGS: Cardiomediastinal silhouette is normal. Calcified aortic knob. Chronic interstitial changes. Faint airspace opacity RIGHT upper quadrant. Similar LEFT lung base pleural thickening and strandy densities. No pneumothorax. Osteopenia. IMPRESSION: RIGHT upper lobe pneumonia versus prominent RIGHT anterior first  rib. Chronic interstitial changes. Followup PA and lateral chest X-ray is recommended in 3-4 weeks following trial of antibiotic therapy to ensure resolution and exclude underlying malignancy. LEFT lung base atelectasis/scarring. Aortic Atherosclerosis (ICD10-I70.0). Electronically Signed   By: Elon Alas M.D.   On: 10/03/2017 06:37   Ct Chest Wo Contrast  Result Date: 10/05/2017 CLINICAL DATA:  83 year old female with history of dyspnea. Additional history of breast cancer. EXAM: CT CHEST WITHOUT CONTRAST TECHNIQUE: Multidetector CT imaging of the chest was performed following the standard protocol without IV contrast. COMPARISON:  Chest CT 08/15/2015. FINDINGS: Cardiovascular: Heart size is normal. There is no significant pericardial fluid, thickening or pericardial calcification. There is aortic atherosclerosis, as well as atherosclerosis of the great vessels of the mediastinum and the coronary arteries, including calcified atherosclerotic plaque in the left main, left anterior descending, left circumflex and right coronary arteries. Severe calcifications of the aortic valve. Moderate calcifications of the mitral annulus. Mediastinum/Nodes: No pathologically enlarged mediastinal or hilar lymph nodes. Esophagus is unremarkable in appearance. No axillary lymphadenopathy. Lungs/Pleura: In the medial aspect of the right lower lobe there is an apparent 2.3 x 1.4 cm nodule which encases several bronchi to the basal segments of the right lower lobe (axial image 90 of series 3) and is new compared to the prior study. Scattered areas of subsegmental atelectasis are noted throughout the lung bases bilaterally. Trace amount of pleural fluid and/or thickening noted bilaterally. No confluent consolidative airspace disease. In the apex of the right upper lobe there is an new nodular area of ground-glass attenuation measuring 2.5 x 1.8 cm (axial image 17 of series 3). Upper Abdomen: 2.1 cm low-attenuation lesion in  segment 7 of the liver is incompletely characterized on today's noncontrast CT examination, but stable compared to the prior study from 2016, presumably a cyst. Aortic atherosclerosis. Musculoskeletal: There are no aggressive appearing lytic or blastic lesions noted in the visualized portions of the skeleton. IMPRESSION: 1. New nodules in the right lung. The largest of these is a ground-glass attenuation nodule in the apex of the right upper lobe measuring 2.5 x 1.8 cm. However, there is also a solid-appearing nodule in the medial aspect of the right lower lobe (axial  image 90 of series 3) measuring 2.3 x 1.4 cm. If there is clinical concern for malignancy at this time, further evaluation with PET-CT could be considered. Alternatively, consider one of the following in 3 months for both low-risk and high-risk individuals: (a) repeat chest CT or (b) follow-up PET-CT. This recommendation follows the consensus statement: Guidelines for Management of Incidental Pulmonary Nodules Detected on CT Images:From the Fleischner Society 2017; published online before print (10.1148/radiol.[id number]). 2. Trace amount of dependent pericardial fluid and/or thickening bilaterally. 3. Aortic atherosclerosis, in addition to left main and 3 vessel coronary artery disease. 4. There are calcifications of the aortic valve and mitral annulus. Echocardiographic correlation for evaluation of potential valvular dysfunction may be warranted if clinically indicated. Aortic Atherosclerosis (ICD10-I70.0). Electronically Signed   By: Vinnie Langton M.D.   On: 10/05/2017 10:19    Assessment/Plan 1.  Pressure injury of left buttock, unstageable   Clindamycin 150 mg IM x1 now  Clindamycin 300 mg p.o. q. 8 hours x 7 days  Risa-Bid probiotic 1 tablet p.o. twice daily  Pack wound with 4 x 4 soaked in Dakin's solution and cover with Allevyn dressing twice daily  Air mattress for the bed  Turn and reposition every 2 hours  2.  Protein  calorie malnutrition, unspecified severity  Pro-stat 30 mL p.o. twice daily  Ensure Enlive 1 bottle p.o. twice daily  Mirtazapine 7.5 mg p.o. nightly for appetite stimulant  Family/ staff Communication:   Total Time: 60 minutes  Documentation: 10 minutes  Face to Face: 45 minutes  Family/Phone: 5 minutes   Labs/tests ordered: Wound culture  Medication list reviewed and assessed for continued appropriateness.  Vikki Ports, NP-C Geriatrics [facility] [phone] [place], [place] [address] Cell Phone (Mon-Fri 8am-5pm):  [phone] On Call:  [phone] & follow prompts after 5pm & weekends Office Phone:  [phone] Office Fax:  [phone]

## 2017-10-19 ENCOUNTER — Non-Acute Institutional Stay (SKILLED_NURSING_FACILITY): Payer: Medicare Other | Admitting: Gerontology

## 2017-10-19 DIAGNOSIS — L89320 Pressure ulcer of left buttock, unstageable: Secondary | ICD-10-CM

## 2017-10-19 DIAGNOSIS — J189 Pneumonia, unspecified organism: Secondary | ICD-10-CM

## 2017-10-19 DIAGNOSIS — Z515 Encounter for palliative care: Secondary | ICD-10-CM | POA: Diagnosis not present

## 2017-10-19 LAB — AEROBIC CULTURE W GRAM STAIN (SUPERFICIAL SPECIMEN)

## 2017-10-19 LAB — AEROBIC CULTURE  (SUPERFICIAL SPECIMEN)

## 2017-10-23 ENCOUNTER — Encounter: Payer: Self-pay | Admitting: Gerontology

## 2017-10-23 NOTE — Progress Notes (Signed)
Location:   [facility] Room Number: 203B Place of Service:  SNF [phone]) Provider:  Toni Arthurs, NP-C  System, Pcp Not In  Patient Care Team: System, Pcp Not In as PCP - General  Extended Emergency Contact Information Primary Emergency Contact: Barnett,David L Address: [address]          [place],  [address] [place] Phone: [phone] Relation: Son Secondary Emergency Contact: Minna Merritts [place] of [place] Phone: [phone] Relation: Son  Code Status:  DNR Goals of care: Advanced Directive information Advanced Directives 10/19/2017  Does Patient Have a Medical Advance Directive? Yes  Type of Advance Directive Out of facility DNR (pink MOST or yellow form)  Does patient want to make changes to medical advance directive? No - Patient declined  Copy of [place] in Chart? -  Pre-existing out of facility DNR order (yellow form or pink MOST form) -     Chief Complaint  Patient presents with  . Acute Visit    Recheck for congestion and wheezing    HPI:  Pt is a 83 y.o. female seen today for an acute visit for coughing and congestion.  Staff is concerned due to patient having increased cough, congestion, wheezing and malaise.  Staff is saying she "just does not look good."  On interview, patient reports she is not feeling well.  Says she is not able to expectorate any mucus with the cough.  Says she feels she cannot catch her breath.  Patient reports decreased appetite.  Patient also appears pale with tachycardia.  Generally ill-appearing.  Patient denies n/v/d/f/c/cp/ha/abd pain/dizziness/pain.  Patient has been refusing meds.  Otherwise, vital signs stable.   Past Medical History:  Diagnosis Date  . Acquired hypothyroidism 09/28/2017   unspecified  . Acute on chronic systolic CHF (congestive heart failure), NYHA class 4 ([place]) 09/28/2017  . Benign essential hypertension  09/28/2017  . Blind   . Cancer [facility])    breast, s/p mastectomy, no recurrence  . Colon polyps   . Diabetes mellitus without complication ([place])   . DM type 2 with diabetic mixed hyperlipidemia ([place]) 09/28/2017  . Hyperlipidemia   . Hypertension   . Mixed hyperlipidemia 09/28/2017  . Primary osteoarthritis of both knees 09/28/2017   post bilateral knee replacements  . Subclavian steal syndrome    Past Surgical History:  Procedure Laterality Date  . ABDOMINAL HYSTERECTOMY    . MASTECTOMY Right   . REPLACEMENT TOTAL KNEE BILATERAL    . TOTAL HIP ARTHROPLASTY Left 2000    Allergies  Allergen Reactions  . Etodolac      Other Reaction: vaginal knots  . Penicillins Anaphylaxis and Rash    Allergies as of 10/19/2017      Reactions   Etodolac    Other Reaction: vaginal knots   Penicillins Anaphylaxis, Rash      Medication List        Accurate as of 10/19/17 11:59 PM. Always use your most recent med list.          aspirin 81 MG chewable tablet Chew 81 mg daily by mouth.   benzonatate 200 MG capsule Commonly known as:  TESSALON Take 1 capsule (200 mg total) by mouth 3 (three) times daily.   bisacodyl 10 MG suppository Commonly known as:  DULCOLAX Place 10 mg rectally daily as needed.   D3 HIGH POTENCY 1000 units capsule Generic drug:  Cholecalciferol Take 1,000 Units by mouth  daily.   ENSURE ENLIVE PO Take 1 Bottle 2 (two) times daily between meals by mouth.   feeding supplement (PRO-STAT SUGAR FREE 64) Liqd Take 30 mLs 2 (two) times daily between meals by mouth.   fluticasone 50 MCG/ACT nasal spray Commonly known as:  FLONASE Place 2 sprays into both nostrils daily.   furosemide 20 MG tablet Commonly known as:  LASIX Take 20 mg by mouth daily.   guaiFENesin 600 MG 12 hr tablet Commonly known as:  MUCINEX Take 1 tablet (600 mg total) by mouth 2 (two) times daily.   guaiFENesin-dextromethorphan 100-10 MG/5ML syrup Commonly known as:  ROBITUSSIN DM Take  5 mLs by mouth every 4 (four) hours as needed for cough.   ipratropium-albuterol 0.5-2.5 (3) MG/3ML Soln Commonly known as:  DUONEB Take 3 mLs by nebulization every 6 (six) hours as needed.   levothyroxine 25 MCG tablet Commonly known as:  SYNTHROID, LEVOTHROID Take 25 mcg daily before breakfast by mouth.   LORazepam 0.5 MG tablet Commonly known as:  ATIVAN Take 0.5 mg every 4 (four) hours as needed by mouth.   magnesium hydroxide 400 MG/5ML suspension Commonly known as:  MILK OF MAGNESIA Take 30 mLs every 4 (four) hours as needed by mouth for mild constipation. Constipation/ no BM for 2 days   mirtazapine 7.5 MG tablet Commonly known as:  REMERON Take 7.5 mg at bedtime by mouth.   morphine 20 MG/ML concentrated solution Commonly known as:  ROXANOL Take 5-10 mg every hour as needed by mouth for severe pain. 0.25 ml (5 mg) give mild pain, 0.5 ml (10 mg) give for moderate to severe pain   OXYGEN Inhale 2-5 L continuous into the lungs. For comfort   pantoprazole 40 MG tablet Commonly known as:  PROTONIX Take 1 tablet (40 mg total) by mouth daily.   RISA-BID PROBIOTIC Tabs Take 1 tablet 2 (two) times daily by mouth.   vitamin B-12 1000 MCG tablet Commonly known as:  CYANOCOBALAMIN Take 1,000 mcg by mouth daily.       Review of Systems  Constitutional: Positive for activity change, appetite change and fatigue. Negative for chills, diaphoresis and fever.  HENT: Positive for congestion. Negative for sneezing, sore throat, trouble swallowing and voice change.   Respiratory: Positive for cough, shortness of breath and wheezing. Negative for apnea, choking and chest tightness.   Cardiovascular: Negative for chest pain, palpitations and leg swelling.  Gastrointestinal: Negative for abdominal distention, abdominal pain, constipation, diarrhea and nausea.  Genitourinary: Negative for difficulty urinating, dysuria, frequency and urgency.  Musculoskeletal: Negative for back pain,  gait problem and myalgias. Arthralgias: typical arthritis.  Skin: Positive for wound. Negative for color change, pallor and rash.  Neurological: Positive for weakness. Negative for dizziness, tremors, syncope, speech difficulty, numbness and headaches.  Psychiatric/Behavioral: Negative for agitation and behavioral problems.  All other systems reviewed and are negative.   Immunization History  Administered Date(s) Administered  . Influenza-Unspecified 09/12/2012, 08/28/2013, 08/28/2014, 09/13/2015, 09/09/2016  . Pneumococcal Conjugate-13 11/21/2014   Pertinent  Health Maintenance Due  Topic Date Due  . FOOT EXAM  02/15/1932  . OPHTHALMOLOGY EXAM  02/15/1932  . URINE MICROALBUMIN  [birth year]15/1934  . DEXA SCAN  02/15/1989  . PNA vac Low Risk Adult (2 of 2 - PPSV23) 11/22/2015  . HEMOGLOBIN A1C  02/11/2016  . INFLUENZA VACCINE  07/11/2017   No flowsheet data found. Functional Status Survey:    Vitals:   10/19/17 1529  BP: 119/67  Pulse: 60  Resp:  16  Temp: 98.1 F (36.7 C)  TempSrc: Oral  SpO2: 94%  Weight: 181 lb 1.6 oz (82.1 kg)  Height: 5\' 5"  (1.651 m)   Body mass index is 30.14 kg/m. Physical Exam  Constitutional: She is oriented to person, place, and time. Vital signs are normal. She appears well-developed and well-nourished. She appears lethargic. She is active and cooperative. She has a sickly appearance. She appears ill. No distress. Nasal cannula in place.  HENT:  Head: Normocephalic and atraumatic.  Mouth/Throat: Uvula is midline, oropharynx is clear and moist and mucous membranes are normal. Mucous membranes are not pale, not dry and not cyanotic.  Eyes: Conjunctivae, EOM and lids are normal. Pupils are equal, round, and reactive to light.  Neck: Trachea normal, normal range of motion and full passive range of motion without pain. Neck supple. No JVD present. No tracheal deviation, no edema and no erythema present. No thyromegaly present.  Cardiovascular: Normal rate,  normal heart sounds, intact distal pulses and normal pulses. An irregular rhythm present. Exam reveals no gallop, no distant heart sounds and no friction rub.  No murmur heard. Pulses:      Dorsalis pedis pulses are 2+ on the right side, and 2+ on the left side.  No edema  Pulmonary/Chest: Effort normal. No accessory muscle usage. No respiratory distress. She has wheezes in the right upper field, the right middle field, the right lower field, the left upper field, the left middle field and the left lower field. She has rhonchi in the right upper field, the right middle field, the right lower field, the left upper field, the left middle field and the left lower field. She has rales in the right lower field and the left lower field. She exhibits no tenderness.  Abdominal: Normal appearance and bowel sounds are normal. She exhibits no distension and no ascites. There is no tenderness.  Musculoskeletal: Normal range of motion. She exhibits no edema or tenderness.  Expected osteoarthritis, stiffness; generalized weakness  Neurological: She is oriented to person, place, and time. She has normal strength. She appears lethargic.  Skin: Skin is warm. She is diaphoretic. No cyanosis. No pallor. Nails show no clubbing.     Psychiatric: She has a normal mood and affect. Her speech is normal and behavior is normal. Judgment and thought content normal. Cognition and memory are normal.  Nursing note and vitals reviewed.   Labs reviewed: Recent Labs    10/03/17 0544 10/04/17 0502  NA 137 137  K 4.6 4.4  CL 98* 99*  CO2 28 31  GLUCOSE 103* 95  BUN 31* 33*  CREATININE 1.53* 1.46*  CALCIUM 8.7* 8.4*   No results for input(s): AST, ALT, ALKPHOS, BILITOT, PROT, ALBUMIN in the last 8760 hours. Recent Labs    10/03/17 0544 10/04/17 0502 10/06/17 1315  WBC 15.0* 14.1* 15.6*  NEUTROABS 7.6*  --   --   HGB 11.7* 10.4* 10.3*  HCT 36.1 32.4* 31.5*  MCV 93.2 93.3 93.0  PLT 205 163 192   No results  found for: TSH Lab Results  Component Value Date   HGBA1C 5.9 08/14/2015   No results found for: CHOL, HDL, LDLCALC, LDLDIRECT, TRIG, CHOLHDL  Significant Diagnostic Results in last 30 days:  Dg Chest 2 View  Result Date: 10/05/2017 CLINICAL DATA:  Hypertension.  Shortness of breath. EXAM: CHEST  2 VIEW COMPARISON:  October 03, 2017 FINDINGS: There is rather subtle patchy airspace opacity in the right upper lobe, stable. There is mild  scarring in each apex. There is a small left pleural effusion with slight bibasilar atelectasis. Lungs elsewhere are clear. Heart is upper normal in size with pulmonary vascularity within normal limits. There is aortic atherosclerosis. No adenopathy is evident by radiography. Bones are osteoporotic. There is degenerative change in each shoulder. IMPRESSION: Persistent subtle airspace opacity right upper lobe. There is apical scarring bilaterally. Small left pleural effusion noted with mild bibasilar atelectasis. Heart upper normal in size. There is aortic atherosclerosis. Bones osteoporotic. Aortic Atherosclerosis (ICD10-I70.0). Electronically Signed   By: Lowella Grip III M.D.   On: 10/05/2017 10:01   Dg Chest 2 View  Result Date: 10/03/2017 CLINICAL DATA:  Progressive cough for 1 week. Hypoxia. Shortness of breath. History of breast cancer, diabetes. EXAM: CHEST  2 VIEW COMPARISON:  Chest radiograph August 14, 2015 FINDINGS: Cardiomediastinal silhouette is normal. Calcified aortic knob. Chronic interstitial changes. Faint airspace opacity RIGHT upper quadrant. Similar LEFT lung base pleural thickening and strandy densities. No pneumothorax. Osteopenia. IMPRESSION: RIGHT upper lobe pneumonia versus prominent RIGHT anterior first rib. Chronic interstitial changes. Followup PA and lateral chest X-ray is recommended in 3-4 weeks following trial of antibiotic therapy to ensure resolution and exclude underlying malignancy. LEFT lung base atelectasis/scarring. Aortic  Atherosclerosis (ICD10-I70.0). Electronically Signed   By: Elon Alas M.D.   On: 10/03/2017 06:37   Ct Chest Wo Contrast  Result Date: 10/05/2017 CLINICAL DATA:  83 year old female with history of dyspnea. Additional history of breast cancer. EXAM: CT CHEST WITHOUT CONTRAST TECHNIQUE: Multidetector CT imaging of the chest was performed following the standard protocol without IV contrast. COMPARISON:  Chest CT 08/15/2015. FINDINGS: Cardiovascular: Heart size is normal. There is no significant pericardial fluid, thickening or pericardial calcification. There is aortic atherosclerosis, as well as atherosclerosis of the great vessels of the mediastinum and the coronary arteries, including calcified atherosclerotic plaque in the left main, left anterior descending, left circumflex and right coronary arteries. Severe calcifications of the aortic valve. Moderate calcifications of the mitral annulus. Mediastinum/Nodes: No pathologically enlarged mediastinal or hilar lymph nodes. Esophagus is unremarkable in appearance. No axillary lymphadenopathy. Lungs/Pleura: In the medial aspect of the right lower lobe there is an apparent 2.3 x 1.4 cm nodule which encases several bronchi to the basal segments of the right lower lobe (axial image 90 of series 3) and is new compared to the prior study. Scattered areas of subsegmental atelectasis are noted throughout the lung bases bilaterally. Trace amount of pleural fluid and/or thickening noted bilaterally. No confluent consolidative airspace disease. In the apex of the right upper lobe there is an new nodular area of ground-glass attenuation measuring 2.5 x 1.8 cm (axial image 17 of series 3). Upper Abdomen: 2.1 cm low-attenuation lesion in segment 7 of the liver is incompletely characterized on today's noncontrast CT examination, but stable compared to the prior study from 2016, presumably a cyst. Aortic atherosclerosis. Musculoskeletal: There are no aggressive appearing  lytic or blastic lesions noted in the visualized portions of the skeleton. IMPRESSION: 1. New nodules in the right lung. The largest of these is a ground-glass attenuation nodule in the apex of the right upper lobe measuring 2.5 x 1.8 cm. However, there is also a solid-appearing nodule in the medial aspect of the right lower lobe (axial image 90 of series 3) measuring 2.3 x 1.4 cm. If there is clinical concern for malignancy at this time, further evaluation with PET-CT could be considered. Alternatively, consider one of the following in 3 months for both low-risk and  high-risk individuals: (a) repeat chest CT or (b) follow-up PET-CT. This recommendation follows the consensus statement: Guidelines for Management of Incidental Pulmonary Nodules Detected on CT Images:From the Fleischner Society 2017; published online before print (10.1148/radiol.0938182993). 2. Trace amount of dependent pericardial fluid and/or thickening bilaterally. 3. Aortic atherosclerosis, in addition to left main and 3 vessel coronary artery disease. 4. There are calcifications of the aortic valve and mitral annulus. Echocardiographic correlation for evaluation of potential valvular dysfunction may be warranted if clinically indicated. Aortic Atherosclerosis (ICD10-I70.0). Electronically Signed   By: Vinnie Langton M.D.   On: 10/05/2017 10:19    Assessment/Plan 1.  Pneumonia due to infectious organism, unspecified laterality, unspecified part of lung  2 view Chest x-ray  Oxygen 2-5 L nasal cannula.  Titrate to maintain sats greater than 92%  Levaquin 750 mg p.o. x1 now  Levaquin 500 mg p.o. daily times 6 days  DuoNeb's 3 times daily scheduled times 3 days  Pulmicort nebulizer 0.5 mg 3 times daily scheduled times 3 days  2.   Pressure injury of left buttock, unstageable  Wound culture positive for MRSA  Contact precautions for MRSA  Appropriate signage on the door  PPE equipment on door  Continue twice daily dressing  changes as previously ordered  Clindamycin 300 mg p.o. every 6 hours times 7 days  3. Encounter for dying care  Hospice Referral for In-Facility services RE: Sepsis Secondary: PNA, UTI, MRSA in ischial wound, dysphagia, CAD.  Special Instructions: Family declined further rehab or antibiotics/ no further treatment effective 10/19/17- per Hetty Blend  Morphine concentrate 20 mg/mL 0.25-0.5 mL p.o. every hour as needed pain, dyspnea  Lorazepam 0.5 mg p.o. every 4 hours as needed anxiety, agitation  DC all antibiotics as listed above  Comfort measures only  **I had long discussion with patient's son regarding his and his brothers wishes for the patient.  Family also spoke with palliative care nurse practitioner.  Family verbalized patient had said previously that she was tired of living and was ready to die.  Signs agreed they did not want to continue to prolong her suffering.  Signs and I discussed the increased risk of faster mortality if antibiotics were stopped.  We also discussed that the ordered breathing treatments were not life-sustaining, but would enable patient to breathe easier and have more comfort.  Both sons agreed they wanted patient to have as needed Roxanol for dyspnea and as needed lorazepam for potential anxiety.  Patient is already a DNR.  Per family wishes, all antibiotic orders were discontinued and comfort measures initiated.**   Family/ staff Communication:   Total Time: 55 minutes  Documentation: 15 minutes  Face to Face: 20 minutes  Family/Phone: 20 minutes   Labs/tests ordered: 2 view chest x-ray-later canceled per family request  Medication list reviewed and assessed for continued appropriateness.  Vikki Ports, NP-C Geriatrics [facility] [phone], [place] [address] Cell Phone (Mon-Fri 8am-5pm):  [phone] On Call:  [phone] & follow prompts after 5pm & weekends Office Phone:  [phone] Office  Fax:  [phone]

## 2017-10-26 ENCOUNTER — Inpatient Hospital Stay: Payer: Medicare Other | Admitting: Pulmonary Disease

## 2017-11-04 DIAGNOSIS — E46 Unspecified protein-calorie malnutrition: Secondary | ICD-10-CM | POA: Insufficient documentation

## 2017-11-10 DEATH — deceased
